# Patient Record
Sex: Male | Born: 1940 | Race: White | Hispanic: No | Marital: Single | Smoking: Never smoker
Health system: Southern US, Community
[De-identification: ages and names within clinical notes are randomized; demographics above are authoritative.]

## PROBLEM LIST (undated history)

## (undated) DIAGNOSIS — N200 Calculus of kidney: Secondary | ICD-10-CM

## (undated) DIAGNOSIS — E785 Hyperlipidemia, unspecified: Secondary | ICD-10-CM

## (undated) DIAGNOSIS — E1169 Type 2 diabetes mellitus with other specified complication: Secondary | ICD-10-CM

## (undated) DIAGNOSIS — N289 Disorder of kidney and ureter, unspecified: Secondary | ICD-10-CM

## (undated) DIAGNOSIS — I10 Essential (primary) hypertension: Secondary | ICD-10-CM

## (undated) HISTORY — PX: LUMBAR FUSION: SHX111

## (undated) HISTORY — PX: KNEE ARTHROSCOPY: SUR90

## (undated) SURGERY — ERCP, WITH INTERVENTION IF INDICATED
Anesthesia: General

---

## 2011-01-14 ENCOUNTER — Emergency Department (HOSPITAL_COMMUNITY)
Admission: EM | Admit: 2011-01-14 | Discharge: 2011-01-14 | Disposition: A | Discharge status: Home / Self Care | Payer: Medicare Other | Attending: Emergency Medicine | Admitting: Emergency Medicine

## 2011-01-14 DIAGNOSIS — E119 Type 2 diabetes mellitus without complications: Secondary | ICD-10-CM | POA: Insufficient documentation

## 2011-01-14 DIAGNOSIS — Z87442 Personal history of urinary calculi: Secondary | ICD-10-CM | POA: Insufficient documentation

## 2011-01-14 DIAGNOSIS — R339 Retention of urine, unspecified: Secondary | ICD-10-CM | POA: Insufficient documentation

## 2011-01-14 DIAGNOSIS — I10 Essential (primary) hypertension: Secondary | ICD-10-CM | POA: Insufficient documentation

## 2011-01-14 DIAGNOSIS — N289 Disorder of kidney and ureter, unspecified: Secondary | ICD-10-CM | POA: Insufficient documentation

## 2011-01-14 LAB — URINALYSIS, ROUTINE W REFLEX MICROSCOPIC
Bilirubin Urine: NEGATIVE
Glucose, UA: NEGATIVE mg/dL
Specific Gravity, Urine: 1.013 (ref 1.005–1.030)

## 2011-01-14 LAB — URINE MICROSCOPIC-ADD ON

## 2011-02-16 ENCOUNTER — Emergency Department (HOSPITAL_COMMUNITY)
Admission: EM | Admit: 2011-02-16 | Discharge: 2011-02-16 | Disposition: A | Discharge status: Home / Self Care | Payer: Medicare Other | Attending: Emergency Medicine | Admitting: Emergency Medicine

## 2011-02-16 DIAGNOSIS — Z87442 Personal history of urinary calculi: Secondary | ICD-10-CM | POA: Insufficient documentation

## 2011-02-16 DIAGNOSIS — E119 Type 2 diabetes mellitus without complications: Secondary | ICD-10-CM | POA: Insufficient documentation

## 2011-02-16 DIAGNOSIS — E785 Hyperlipidemia, unspecified: Secondary | ICD-10-CM | POA: Insufficient documentation

## 2011-02-16 DIAGNOSIS — N400 Enlarged prostate without lower urinary tract symptoms: Secondary | ICD-10-CM | POA: Insufficient documentation

## 2011-02-16 DIAGNOSIS — R339 Retention of urine, unspecified: Secondary | ICD-10-CM | POA: Insufficient documentation

## 2011-02-16 DIAGNOSIS — R109 Unspecified abdominal pain: Secondary | ICD-10-CM | POA: Insufficient documentation

## 2011-02-16 DIAGNOSIS — I10 Essential (primary) hypertension: Secondary | ICD-10-CM | POA: Insufficient documentation

## 2011-08-15 ENCOUNTER — Emergency Department (HOSPITAL_COMMUNITY)
Admission: EM | Admit: 2011-08-15 | Discharge: 2011-08-15 | Disposition: A | Discharge status: Home / Self Care | Payer: Medicare Other | Attending: Emergency Medicine | Admitting: Emergency Medicine

## 2011-08-15 ENCOUNTER — Encounter: Payer: Self-pay | Admitting: *Deleted

## 2011-08-15 DIAGNOSIS — W540XXA Bitten by dog, initial encounter: Secondary | ICD-10-CM | POA: Insufficient documentation

## 2011-08-15 DIAGNOSIS — S41109A Unspecified open wound of unspecified upper arm, initial encounter: Secondary | ICD-10-CM | POA: Insufficient documentation

## 2011-08-15 DIAGNOSIS — S41159A Open bite of unspecified upper arm, initial encounter: Secondary | ICD-10-CM

## 2011-08-15 HISTORY — DX: Essential (primary) hypertension: I10

## 2011-08-15 MED ORDER — AMOXICILLIN-POT CLAVULANATE 875-125 MG PO TABS
1.0000 | ORAL_TABLET | Freq: Once | ORAL | Status: AC
  Administered 2011-08-15: 1 via ORAL
  Filled 2011-08-15: qty 1

## 2011-08-15 MED ORDER — TETANUS-DIPHTH-ACELL PERTUSSIS 5-2.5-18.5 LF-MCG/0.5 IM SUSP
0.5000 mL | Freq: Once | INTRAMUSCULAR | Status: DC
  Filled 2011-08-15: qty 0.5

## 2011-08-15 MED ORDER — AMOXICILLIN-POT CLAVULANATE 875-125 MG PO TABS: 1.0000 | ORAL_TABLET | Freq: Two times a day (BID) | ORAL | Status: AC

## 2011-08-15 MED ORDER — LIDOCAINE-EPINEPHRINE 2 %-1:100000 IJ SOLN
INTRAMUSCULAR | Status: AC
  Administered 2011-08-15: 1 mL
  Filled 2011-08-15: qty 1

## 2011-08-15 NOTE — ED Notes (Signed)
Pt reports his dog bit or clawed him cutting his L forearm. Has used 3 sets of bandaids in last hour to control bleeding. Sts dog is UTD on shots.

## 2011-08-16 NOTE — ED Provider Notes (Signed)
Medical screening examination/treatment/procedure(s) were conducted as a shared visit with non-physician practitioner(s) and myself.  I personally evaluated the patient during the encounter  Hurman Horn, MD 08/16/11 1311

## 2011-08-16 NOTE — ED Provider Notes (Signed)
History     CSN: [id number]  Arrival date & time 08/15/11  1831   First MD Initiated Contact with Patient 08/15/11 1907      Chief Complaint  Patient presents with  . Laceration    (Consider location/radiation/quality/duration/timing/severity/associated sxs/prior treatment) Patient is a 70 y.o. male presenting with animal bite. The history is provided by the patient.  Animal Bite  The incident occurred today. The incident occurred at home. There is an injury to the left forearm. The pain is mild. It is unlikely that a foreign body is present. Pertinent negatives include no numbness, no inability to bear weight, no pain when bearing weight, no focal weakness and no tingling. There have been no prior injuries to these areas. He is right-handed. His tetanus status is out of date.  Pt states he was bit by his dog by accident. 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 Rabies and all vaccines on dog up to date. Pt states unable to stop bleeding at home. No weakness or numbness distal tot he injury.    Past Medical History  Diagnosis Date  . Hypertension   . Diabetes mellitus     History reviewed. No pertinent past surgical history.  No family history on file.  History  Substance Use Topics  . Smoking status: Never Smoker   . Smokeless tobacco: Not on file  . Alcohol Use: Yes     occasionally      Review of Systems  Skin: Positive for wound.  Neurological: Negative for tingling, focal weakness and numbness.  All other systems reviewed and are negative.    Allergies  Review of patient's allergies indicates no known allergies.  Home Medications   Current Outpatient Rx  Name Route Sig Dispense Refill  . AMOXICILLIN-POT CLAVULANATE 875-125 MG PO TABS Oral Take 1 tablet by mouth 2 (two) times daily. 14 tablet 0    BP 115/70  Pulse 60  Temp(Src) 97.8 F (36.6 C) (Oral)  Resp 18  SpO2 98%  Physical Exam  Nursing note and vitals reviewed. Constitutional: He is oriented to  person, place, and time. He appears well-developed and well-nourished. No distress.  HENT:  Head: Normocephalic and atraumatic.  Neck: Neck supple.  Cardiovascular: Normal rate, regular rhythm and normal heart sounds.   Pulmonary/Chest: Effort normal and breath sounds normal. No respiratory distress.  Musculoskeletal: Normal range of motion.       Full rom of wrist and elbow. No bony tenderness  Neurological: He is alert and oriented to person, place, and time.  Skin: Skin is warm and dry.       Two skin tears to the left forearm. No puncture marks or lacerations other then skin tears noted. Both skin tears about 2 cm in diamter, oozing blood  Psychiatric: He has a normal mood and affect.    ED Course  Procedures (including critical care time)  Wounds irrigated thoroughly. NO foreign body palpated. Wounds numbed for full examination with lidocaine 2% w/epi. 2mL used in each skin tear.  Thrombi pad applied and sterile dressing. Will d/chome. No puncture marks. No repair indicated. Tetanus updated.  1. Dog bite of arm       MDM          Lottie Mussel, PA 08/16/11 [phone]

## 2011-08-25 DIAGNOSIS — L820 Inflamed seborrheic keratosis: Secondary | ICD-10-CM | POA: Diagnosis not present

## 2011-08-31 DIAGNOSIS — H25019 Cortical age-related cataract, unspecified eye: Secondary | ICD-10-CM | POA: Diagnosis not present

## 2011-08-31 DIAGNOSIS — D3130 Benign neoplasm of unspecified choroid: Secondary | ICD-10-CM | POA: Diagnosis not present

## 2011-08-31 DIAGNOSIS — H35369 Drusen (degenerative) of macula, unspecified eye: Secondary | ICD-10-CM | POA: Diagnosis not present

## 2011-08-31 DIAGNOSIS — H2510 Age-related nuclear cataract, unspecified eye: Secondary | ICD-10-CM | POA: Diagnosis not present

## 2011-10-23 DIAGNOSIS — D239 Other benign neoplasm of skin, unspecified: Secondary | ICD-10-CM | POA: Diagnosis not present

## 2011-12-01 DIAGNOSIS — E119 Type 2 diabetes mellitus without complications: Secondary | ICD-10-CM | POA: Diagnosis not present

## 2011-12-01 DIAGNOSIS — N200 Calculus of kidney: Secondary | ICD-10-CM | POA: Diagnosis not present

## 2011-12-01 DIAGNOSIS — M1710 Unilateral primary osteoarthritis, unspecified knee: Secondary | ICD-10-CM | POA: Diagnosis not present

## 2011-12-01 DIAGNOSIS — E785 Hyperlipidemia, unspecified: Secondary | ICD-10-CM | POA: Diagnosis not present

## 2012-02-07 DIAGNOSIS — H1130 Conjunctival hemorrhage, unspecified eye: Secondary | ICD-10-CM | POA: Diagnosis not present

## 2012-02-07 DIAGNOSIS — H2510 Age-related nuclear cataract, unspecified eye: Secondary | ICD-10-CM | POA: Diagnosis not present

## 2012-04-26 DIAGNOSIS — R972 Elevated prostate specific antigen [PSA]: Secondary | ICD-10-CM | POA: Diagnosis not present

## 2012-04-26 DIAGNOSIS — I10 Essential (primary) hypertension: Secondary | ICD-10-CM | POA: Diagnosis not present

## 2012-04-26 DIAGNOSIS — E119 Type 2 diabetes mellitus without complications: Secondary | ICD-10-CM | POA: Diagnosis not present

## 2012-04-26 DIAGNOSIS — Z23 Encounter for immunization: Secondary | ICD-10-CM | POA: Diagnosis not present

## 2012-04-26 DIAGNOSIS — E785 Hyperlipidemia, unspecified: Secondary | ICD-10-CM | POA: Diagnosis not present

## 2012-05-07 DIAGNOSIS — R972 Elevated prostate specific antigen [PSA]: Secondary | ICD-10-CM | POA: Diagnosis not present

## 2012-05-07 DIAGNOSIS — N401 Enlarged prostate with lower urinary tract symptoms: Secondary | ICD-10-CM | POA: Diagnosis not present

## 2012-09-06 DIAGNOSIS — M1710 Unilateral primary osteoarthritis, unspecified knee: Secondary | ICD-10-CM | POA: Diagnosis not present

## 2012-09-06 DIAGNOSIS — R972 Elevated prostate specific antigen [PSA]: Secondary | ICD-10-CM | POA: Diagnosis not present

## 2012-09-06 DIAGNOSIS — E119 Type 2 diabetes mellitus without complications: Secondary | ICD-10-CM | POA: Diagnosis not present

## 2012-09-06 DIAGNOSIS — N200 Calculus of kidney: Secondary | ICD-10-CM | POA: Diagnosis not present

## 2013-01-02 DIAGNOSIS — H524 Presbyopia: Secondary | ICD-10-CM | POA: Diagnosis not present

## 2013-01-02 DIAGNOSIS — H2510 Age-related nuclear cataract, unspecified eye: Secondary | ICD-10-CM | POA: Diagnosis not present

## 2013-01-02 DIAGNOSIS — H35369 Drusen (degenerative) of macula, unspecified eye: Secondary | ICD-10-CM | POA: Diagnosis not present

## 2013-01-10 DIAGNOSIS — Z6835 Body mass index (BMI) 35.0-35.9, adult: Secondary | ICD-10-CM | POA: Diagnosis not present

## 2013-01-10 DIAGNOSIS — N200 Calculus of kidney: Secondary | ICD-10-CM | POA: Diagnosis not present

## 2013-01-10 DIAGNOSIS — Z1331 Encounter for screening for depression: Secondary | ICD-10-CM | POA: Diagnosis not present

## 2013-01-10 DIAGNOSIS — R972 Elevated prostate specific antigen [PSA]: Secondary | ICD-10-CM | POA: Diagnosis not present

## 2013-01-10 DIAGNOSIS — I10 Essential (primary) hypertension: Secondary | ICD-10-CM | POA: Diagnosis not present

## 2013-01-10 DIAGNOSIS — E1129 Type 2 diabetes mellitus with other diabetic kidney complication: Secondary | ICD-10-CM | POA: Diagnosis not present

## 2013-01-10 DIAGNOSIS — E785 Hyperlipidemia, unspecified: Secondary | ICD-10-CM | POA: Diagnosis not present

## 2013-02-03 DIAGNOSIS — D485 Neoplasm of uncertain behavior of skin: Secondary | ICD-10-CM | POA: Diagnosis not present

## 2013-02-03 DIAGNOSIS — C44319 Basal cell carcinoma of skin of other parts of face: Secondary | ICD-10-CM | POA: Diagnosis not present

## 2013-02-03 DIAGNOSIS — D1801 Hemangioma of skin and subcutaneous tissue: Secondary | ICD-10-CM | POA: Diagnosis not present

## 2013-03-03 DIAGNOSIS — Z85828 Personal history of other malignant neoplasm of skin: Secondary | ICD-10-CM | POA: Diagnosis not present

## 2013-03-03 DIAGNOSIS — C44319 Basal cell carcinoma of skin of other parts of face: Secondary | ICD-10-CM | POA: Diagnosis not present

## 2013-04-17 DIAGNOSIS — R972 Elevated prostate specific antigen [PSA]: Secondary | ICD-10-CM | POA: Diagnosis not present

## 2013-04-24 DIAGNOSIS — R972 Elevated prostate specific antigen [PSA]: Secondary | ICD-10-CM | POA: Diagnosis not present

## 2013-04-24 DIAGNOSIS — N401 Enlarged prostate with lower urinary tract symptoms: Secondary | ICD-10-CM | POA: Diagnosis not present

## 2013-06-06 DIAGNOSIS — N200 Calculus of kidney: Secondary | ICD-10-CM | POA: Diagnosis not present

## 2013-06-06 DIAGNOSIS — Z23 Encounter for immunization: Secondary | ICD-10-CM | POA: Diagnosis not present

## 2013-06-06 DIAGNOSIS — R972 Elevated prostate specific antigen [PSA]: Secondary | ICD-10-CM | POA: Diagnosis not present

## 2013-06-06 DIAGNOSIS — I10 Essential (primary) hypertension: Secondary | ICD-10-CM | POA: Diagnosis not present

## 2013-06-06 DIAGNOSIS — M1710 Unilateral primary osteoarthritis, unspecified knee: Secondary | ICD-10-CM | POA: Diagnosis not present

## 2013-06-06 DIAGNOSIS — Z6835 Body mass index (BMI) 35.0-35.9, adult: Secondary | ICD-10-CM | POA: Diagnosis not present

## 2013-06-06 DIAGNOSIS — E785 Hyperlipidemia, unspecified: Secondary | ICD-10-CM | POA: Diagnosis not present

## 2013-06-06 DIAGNOSIS — E1169 Type 2 diabetes mellitus with other specified complication: Secondary | ICD-10-CM | POA: Diagnosis not present

## 2013-09-11 DIAGNOSIS — I789 Disease of capillaries, unspecified: Secondary | ICD-10-CM | POA: Diagnosis not present

## 2013-09-11 DIAGNOSIS — Z85828 Personal history of other malignant neoplasm of skin: Secondary | ICD-10-CM | POA: Diagnosis not present

## 2013-09-11 DIAGNOSIS — D1801 Hemangioma of skin and subcutaneous tissue: Secondary | ICD-10-CM | POA: Diagnosis not present

## 2013-09-11 DIAGNOSIS — L821 Other seborrheic keratosis: Secondary | ICD-10-CM | POA: Diagnosis not present

## 2013-10-09 DIAGNOSIS — E785 Hyperlipidemia, unspecified: Secondary | ICD-10-CM | POA: Diagnosis not present

## 2013-10-09 DIAGNOSIS — R972 Elevated prostate specific antigen [PSA]: Secondary | ICD-10-CM | POA: Diagnosis not present

## 2013-10-09 DIAGNOSIS — N200 Calculus of kidney: Secondary | ICD-10-CM | POA: Diagnosis not present

## 2013-10-09 DIAGNOSIS — I10 Essential (primary) hypertension: Secondary | ICD-10-CM | POA: Diagnosis not present

## 2013-10-09 DIAGNOSIS — Z6836 Body mass index (BMI) 36.0-36.9, adult: Secondary | ICD-10-CM | POA: Diagnosis not present

## 2013-10-09 DIAGNOSIS — E1169 Type 2 diabetes mellitus with other specified complication: Secondary | ICD-10-CM | POA: Diagnosis not present

## 2014-02-05 DIAGNOSIS — IMO0002 Reserved for concepts with insufficient information to code with codable children: Secondary | ICD-10-CM | POA: Diagnosis not present

## 2014-02-05 DIAGNOSIS — R972 Elevated prostate specific antigen [PSA]: Secondary | ICD-10-CM | POA: Diagnosis not present

## 2014-02-05 DIAGNOSIS — M1710 Unilateral primary osteoarthritis, unspecified knee: Secondary | ICD-10-CM | POA: Diagnosis not present

## 2014-02-05 DIAGNOSIS — Z1331 Encounter for screening for depression: Secondary | ICD-10-CM | POA: Diagnosis not present

## 2014-02-05 DIAGNOSIS — E785 Hyperlipidemia, unspecified: Secondary | ICD-10-CM | POA: Diagnosis not present

## 2014-02-05 DIAGNOSIS — N200 Calculus of kidney: Secondary | ICD-10-CM | POA: Diagnosis not present

## 2014-02-05 DIAGNOSIS — I10 Essential (primary) hypertension: Secondary | ICD-10-CM | POA: Diagnosis not present

## 2014-02-05 DIAGNOSIS — Z6835 Body mass index (BMI) 35.0-35.9, adult: Secondary | ICD-10-CM | POA: Diagnosis not present

## 2014-02-05 DIAGNOSIS — E119 Type 2 diabetes mellitus without complications: Secondary | ICD-10-CM | POA: Diagnosis not present

## 2014-02-25 DIAGNOSIS — H35369 Drusen (degenerative) of macula, unspecified eye: Secondary | ICD-10-CM | POA: Diagnosis not present

## 2014-02-25 DIAGNOSIS — D3130 Benign neoplasm of unspecified choroid: Secondary | ICD-10-CM | POA: Diagnosis not present

## 2014-02-25 DIAGNOSIS — H2510 Age-related nuclear cataract, unspecified eye: Secondary | ICD-10-CM | POA: Diagnosis not present

## 2014-02-25 DIAGNOSIS — H524 Presbyopia: Secondary | ICD-10-CM | POA: Diagnosis not present

## 2014-04-24 DIAGNOSIS — N139 Obstructive and reflux uropathy, unspecified: Secondary | ICD-10-CM | POA: Diagnosis not present

## 2014-04-24 DIAGNOSIS — R972 Elevated prostate specific antigen [PSA]: Secondary | ICD-10-CM | POA: Diagnosis not present

## 2014-04-24 DIAGNOSIS — N401 Enlarged prostate with lower urinary tract symptoms: Secondary | ICD-10-CM | POA: Diagnosis not present

## 2014-04-29 DIAGNOSIS — R972 Elevated prostate specific antigen [PSA]: Secondary | ICD-10-CM | POA: Diagnosis not present

## 2014-04-29 DIAGNOSIS — N138 Other obstructive and reflux uropathy: Secondary | ICD-10-CM | POA: Diagnosis not present

## 2014-04-29 DIAGNOSIS — N401 Enlarged prostate with lower urinary tract symptoms: Secondary | ICD-10-CM | POA: Diagnosis not present

## 2014-04-29 DIAGNOSIS — N139 Obstructive and reflux uropathy, unspecified: Secondary | ICD-10-CM | POA: Diagnosis not present

## 2014-06-06 DIAGNOSIS — N200 Calculus of kidney: Secondary | ICD-10-CM | POA: Diagnosis not present

## 2014-06-06 DIAGNOSIS — E780 Pure hypercholesterolemia: Secondary | ICD-10-CM | POA: Diagnosis not present

## 2014-06-06 DIAGNOSIS — R319 Hematuria, unspecified: Secondary | ICD-10-CM | POA: Diagnosis not present

## 2014-06-06 DIAGNOSIS — R3911 Hesitancy of micturition: Secondary | ICD-10-CM | POA: Diagnosis not present

## 2014-06-06 DIAGNOSIS — I10 Essential (primary) hypertension: Secondary | ICD-10-CM | POA: Diagnosis not present

## 2014-06-06 DIAGNOSIS — Z7982 Long term (current) use of aspirin: Secondary | ICD-10-CM | POA: Diagnosis not present

## 2014-06-11 DIAGNOSIS — D2261 Melanocytic nevi of right upper limb, including shoulder: Secondary | ICD-10-CM | POA: Diagnosis not present

## 2014-06-11 DIAGNOSIS — D225 Melanocytic nevi of trunk: Secondary | ICD-10-CM | POA: Diagnosis not present

## 2014-06-11 DIAGNOSIS — D2262 Melanocytic nevi of left upper limb, including shoulder: Secondary | ICD-10-CM | POA: Diagnosis not present

## 2014-06-11 DIAGNOSIS — Z85828 Personal history of other malignant neoplasm of skin: Secondary | ICD-10-CM | POA: Diagnosis not present

## 2014-06-11 DIAGNOSIS — L821 Other seborrheic keratosis: Secondary | ICD-10-CM | POA: Diagnosis not present

## 2014-06-11 DIAGNOSIS — D1801 Hemangioma of skin and subcutaneous tissue: Secondary | ICD-10-CM | POA: Diagnosis not present

## 2014-06-30 DIAGNOSIS — E785 Hyperlipidemia, unspecified: Secondary | ICD-10-CM | POA: Diagnosis not present

## 2014-06-30 DIAGNOSIS — Z23 Encounter for immunization: Secondary | ICD-10-CM | POA: Diagnosis not present

## 2014-06-30 DIAGNOSIS — R972 Elevated prostate specific antigen [PSA]: Secondary | ICD-10-CM | POA: Diagnosis not present

## 2014-06-30 DIAGNOSIS — M170 Bilateral primary osteoarthritis of knee: Secondary | ICD-10-CM | POA: Diagnosis not present

## 2014-06-30 DIAGNOSIS — E119 Type 2 diabetes mellitus without complications: Secondary | ICD-10-CM | POA: Diagnosis not present

## 2014-06-30 DIAGNOSIS — E559 Vitamin D deficiency, unspecified: Secondary | ICD-10-CM | POA: Diagnosis not present

## 2014-06-30 DIAGNOSIS — Z008 Encounter for other general examination: Secondary | ICD-10-CM | POA: Diagnosis not present

## 2014-06-30 DIAGNOSIS — I10 Essential (primary) hypertension: Secondary | ICD-10-CM | POA: Diagnosis not present

## 2014-06-30 DIAGNOSIS — N200 Calculus of kidney: Secondary | ICD-10-CM | POA: Diagnosis not present

## 2014-10-30 DIAGNOSIS — R972 Elevated prostate specific antigen [PSA]: Secondary | ICD-10-CM | POA: Diagnosis not present

## 2014-11-05 DIAGNOSIS — R972 Elevated prostate specific antigen [PSA]: Secondary | ICD-10-CM | POA: Diagnosis not present

## 2014-11-05 DIAGNOSIS — E119 Type 2 diabetes mellitus without complications: Secondary | ICD-10-CM | POA: Diagnosis not present

## 2014-11-05 DIAGNOSIS — E785 Hyperlipidemia, unspecified: Secondary | ICD-10-CM | POA: Diagnosis not present

## 2014-11-05 DIAGNOSIS — N200 Calculus of kidney: Secondary | ICD-10-CM | POA: Diagnosis not present

## 2014-11-05 DIAGNOSIS — Z6836 Body mass index (BMI) 36.0-36.9, adult: Secondary | ICD-10-CM | POA: Diagnosis not present

## 2014-11-05 DIAGNOSIS — M170 Bilateral primary osteoarthritis of knee: Secondary | ICD-10-CM | POA: Diagnosis not present

## 2015-02-16 DIAGNOSIS — Z85828 Personal history of other malignant neoplasm of skin: Secondary | ICD-10-CM | POA: Diagnosis not present

## 2015-02-16 DIAGNOSIS — L723 Sebaceous cyst: Secondary | ICD-10-CM | POA: Diagnosis not present

## 2015-02-16 DIAGNOSIS — I788 Other diseases of capillaries: Secondary | ICD-10-CM | POA: Diagnosis not present

## 2015-03-17 DIAGNOSIS — R972 Elevated prostate specific antigen [PSA]: Secondary | ICD-10-CM | POA: Diagnosis not present

## 2015-03-17 DIAGNOSIS — E119 Type 2 diabetes mellitus without complications: Secondary | ICD-10-CM | POA: Diagnosis not present

## 2015-03-17 DIAGNOSIS — E785 Hyperlipidemia, unspecified: Secondary | ICD-10-CM | POA: Diagnosis not present

## 2015-03-17 DIAGNOSIS — N200 Calculus of kidney: Secondary | ICD-10-CM | POA: Diagnosis not present

## 2015-03-17 DIAGNOSIS — I10 Essential (primary) hypertension: Secondary | ICD-10-CM | POA: Diagnosis not present

## 2015-03-17 DIAGNOSIS — E559 Vitamin D deficiency, unspecified: Secondary | ICD-10-CM | POA: Diagnosis not present

## 2015-03-17 DIAGNOSIS — Z6836 Body mass index (BMI) 36.0-36.9, adult: Secondary | ICD-10-CM | POA: Diagnosis not present

## 2015-03-17 DIAGNOSIS — Z1389 Encounter for screening for other disorder: Secondary | ICD-10-CM | POA: Diagnosis not present

## 2015-04-30 DIAGNOSIS — N401 Enlarged prostate with lower urinary tract symptoms: Secondary | ICD-10-CM | POA: Diagnosis not present

## 2015-05-05 DIAGNOSIS — N401 Enlarged prostate with lower urinary tract symptoms: Secondary | ICD-10-CM | POA: Diagnosis not present

## 2015-05-05 DIAGNOSIS — N138 Other obstructive and reflux uropathy: Secondary | ICD-10-CM | POA: Diagnosis not present

## 2015-05-05 DIAGNOSIS — R972 Elevated prostate specific antigen [PSA]: Secondary | ICD-10-CM | POA: Diagnosis not present

## 2015-05-09 DIAGNOSIS — R1030 Lower abdominal pain, unspecified: Secondary | ICD-10-CM | POA: Diagnosis not present

## 2015-05-09 DIAGNOSIS — K8020 Calculus of gallbladder without cholecystitis without obstruction: Secondary | ICD-10-CM | POA: Diagnosis not present

## 2015-05-09 DIAGNOSIS — K5730 Diverticulosis of large intestine without perforation or abscess without bleeding: Secondary | ICD-10-CM | POA: Diagnosis not present

## 2015-05-09 DIAGNOSIS — N23 Unspecified renal colic: Secondary | ICD-10-CM | POA: Diagnosis not present

## 2015-05-09 DIAGNOSIS — E780 Pure hypercholesterolemia: Secondary | ICD-10-CM | POA: Diagnosis not present

## 2015-05-09 DIAGNOSIS — I10 Essential (primary) hypertension: Secondary | ICD-10-CM | POA: Diagnosis not present

## 2015-05-09 DIAGNOSIS — Z7982 Long term (current) use of aspirin: Secondary | ICD-10-CM | POA: Diagnosis not present

## 2015-06-17 DIAGNOSIS — D1801 Hemangioma of skin and subcutaneous tissue: Secondary | ICD-10-CM | POA: Diagnosis not present

## 2015-06-17 DIAGNOSIS — L918 Other hypertrophic disorders of the skin: Secondary | ICD-10-CM | POA: Diagnosis not present

## 2015-06-17 DIAGNOSIS — Z85828 Personal history of other malignant neoplasm of skin: Secondary | ICD-10-CM | POA: Diagnosis not present

## 2015-06-17 DIAGNOSIS — L821 Other seborrheic keratosis: Secondary | ICD-10-CM | POA: Diagnosis not present

## 2015-06-17 DIAGNOSIS — D225 Melanocytic nevi of trunk: Secondary | ICD-10-CM | POA: Diagnosis not present

## 2015-06-17 DIAGNOSIS — L570 Actinic keratosis: Secondary | ICD-10-CM | POA: Diagnosis not present

## 2015-06-17 DIAGNOSIS — L812 Freckles: Secondary | ICD-10-CM | POA: Diagnosis not present

## 2015-06-17 DIAGNOSIS — L853 Xerosis cutis: Secondary | ICD-10-CM | POA: Diagnosis not present

## 2015-06-30 DIAGNOSIS — Z23 Encounter for immunization: Secondary | ICD-10-CM | POA: Diagnosis not present

## 2015-08-18 DIAGNOSIS — Z6834 Body mass index (BMI) 34.0-34.9, adult: Secondary | ICD-10-CM | POA: Diagnosis not present

## 2015-08-18 DIAGNOSIS — E559 Vitamin D deficiency, unspecified: Secondary | ICD-10-CM | POA: Diagnosis not present

## 2015-08-18 DIAGNOSIS — M170 Bilateral primary osteoarthritis of knee: Secondary | ICD-10-CM | POA: Diagnosis not present

## 2015-08-18 DIAGNOSIS — E119 Type 2 diabetes mellitus without complications: Secondary | ICD-10-CM | POA: Diagnosis not present

## 2015-08-18 DIAGNOSIS — E784 Other hyperlipidemia: Secondary | ICD-10-CM | POA: Diagnosis not present

## 2015-08-18 DIAGNOSIS — I10 Essential (primary) hypertension: Secondary | ICD-10-CM | POA: Diagnosis not present

## 2015-08-18 DIAGNOSIS — N200 Calculus of kidney: Secondary | ICD-10-CM | POA: Diagnosis not present

## 2015-08-18 DIAGNOSIS — R972 Elevated prostate specific antigen [PSA]: Secondary | ICD-10-CM | POA: Diagnosis not present

## 2015-08-19 DIAGNOSIS — N400 Enlarged prostate without lower urinary tract symptoms: Secondary | ICD-10-CM | POA: Diagnosis not present

## 2015-08-19 DIAGNOSIS — R7989 Other specified abnormal findings of blood chemistry: Secondary | ICD-10-CM | POA: Diagnosis not present

## 2015-08-19 DIAGNOSIS — K828 Other specified diseases of gallbladder: Secondary | ICD-10-CM | POA: Diagnosis not present

## 2015-08-19 DIAGNOSIS — K8020 Calculus of gallbladder without cholecystitis without obstruction: Secondary | ICD-10-CM | POA: Diagnosis not present

## 2015-08-19 DIAGNOSIS — K4020 Bilateral inguinal hernia, without obstruction or gangrene, not specified as recurrent: Secondary | ICD-10-CM | POA: Diagnosis not present

## 2015-08-19 DIAGNOSIS — N200 Calculus of kidney: Secondary | ICD-10-CM | POA: Diagnosis not present

## 2015-09-03 DIAGNOSIS — R945 Abnormal results of liver function studies: Secondary | ICD-10-CM | POA: Diagnosis not present

## 2015-09-03 DIAGNOSIS — R109 Unspecified abdominal pain: Secondary | ICD-10-CM | POA: Diagnosis not present

## 2015-09-03 DIAGNOSIS — Z6834 Body mass index (BMI) 34.0-34.9, adult: Secondary | ICD-10-CM | POA: Diagnosis not present

## 2015-09-14 DIAGNOSIS — Z85828 Personal history of other malignant neoplasm of skin: Secondary | ICD-10-CM | POA: Diagnosis not present

## 2015-09-14 DIAGNOSIS — L309 Dermatitis, unspecified: Secondary | ICD-10-CM | POA: Diagnosis not present

## 2015-09-18 ENCOUNTER — Emergency Department (HOSPITAL_COMMUNITY): Payer: Medicare Other

## 2015-09-18 ENCOUNTER — Encounter (HOSPITAL_COMMUNITY): Payer: Self-pay | Admitting: Emergency Medicine

## 2015-09-18 ENCOUNTER — Inpatient Hospital Stay (HOSPITAL_COMMUNITY)
Admission: EM | Admit: 2015-09-18 | Discharge: 2015-09-24 | DRG: 415 | Disposition: A | Discharge status: Home / Self Care | Payer: Medicare Other | Attending: Internal Medicine | Admitting: Internal Medicine

## 2015-09-18 DIAGNOSIS — K8020 Calculus of gallbladder without cholecystitis without obstruction: Secondary | ICD-10-CM | POA: Diagnosis not present

## 2015-09-18 DIAGNOSIS — E785 Hyperlipidemia, unspecified: Secondary | ICD-10-CM | POA: Diagnosis present

## 2015-09-18 DIAGNOSIS — K838 Other specified diseases of biliary tract: Secondary | ICD-10-CM | POA: Diagnosis not present

## 2015-09-18 DIAGNOSIS — K8012 Calculus of gallbladder with acute and chronic cholecystitis without obstruction: Secondary | ICD-10-CM | POA: Diagnosis not present

## 2015-09-18 DIAGNOSIS — I959 Hypotension, unspecified: Secondary | ICD-10-CM | POA: Diagnosis not present

## 2015-09-18 DIAGNOSIS — R7989 Other specified abnormal findings of blood chemistry: Secondary | ICD-10-CM | POA: Diagnosis not present

## 2015-09-18 DIAGNOSIS — K8050 Calculus of bile duct without cholangitis or cholecystitis without obstruction: Secondary | ICD-10-CM | POA: Diagnosis not present

## 2015-09-18 DIAGNOSIS — Z419 Encounter for procedure for purposes other than remedying health state, unspecified: Secondary | ICD-10-CM

## 2015-09-18 DIAGNOSIS — R338 Other retention of urine: Secondary | ICD-10-CM | POA: Diagnosis not present

## 2015-09-18 DIAGNOSIS — I10 Essential (primary) hypertension: Secondary | ICD-10-CM | POA: Diagnosis present

## 2015-09-18 DIAGNOSIS — Z7984 Long term (current) use of oral hypoglycemic drugs: Secondary | ICD-10-CM

## 2015-09-18 DIAGNOSIS — K8010 Calculus of gallbladder with chronic cholecystitis without obstruction: Secondary | ICD-10-CM | POA: Diagnosis not present

## 2015-09-18 DIAGNOSIS — K660 Peritoneal adhesions (postprocedural) (postinfection): Secondary | ICD-10-CM | POA: Diagnosis present

## 2015-09-18 DIAGNOSIS — K2970 Gastritis, unspecified, without bleeding: Secondary | ICD-10-CM | POA: Diagnosis not present

## 2015-09-18 DIAGNOSIS — K8051 Calculus of bile duct without cholangitis or cholecystitis with obstruction: Secondary | ICD-10-CM | POA: Diagnosis present

## 2015-09-18 DIAGNOSIS — Z5331 Laparoscopic surgical procedure converted to open procedure: Secondary | ICD-10-CM | POA: Diagnosis not present

## 2015-09-18 DIAGNOSIS — R945 Abnormal results of liver function studies: Secondary | ICD-10-CM

## 2015-09-18 DIAGNOSIS — R339 Retention of urine, unspecified: Secondary | ICD-10-CM | POA: Diagnosis present

## 2015-09-18 DIAGNOSIS — Z7982 Long term (current) use of aspirin: Secondary | ICD-10-CM | POA: Diagnosis not present

## 2015-09-18 DIAGNOSIS — E119 Type 2 diabetes mellitus without complications: Secondary | ICD-10-CM

## 2015-09-18 DIAGNOSIS — K8063 Calculus of gallbladder and bile duct with acute cholecystitis with obstruction: Secondary | ICD-10-CM | POA: Diagnosis not present

## 2015-09-18 DIAGNOSIS — K2980 Duodenitis without bleeding: Secondary | ICD-10-CM | POA: Diagnosis present

## 2015-09-18 DIAGNOSIS — K8309 Other cholangitis: Secondary | ICD-10-CM | POA: Diagnosis present

## 2015-09-18 DIAGNOSIS — N179 Acute kidney failure, unspecified: Secondary | ICD-10-CM | POA: Diagnosis present

## 2015-09-18 DIAGNOSIS — N200 Calculus of kidney: Secondary | ICD-10-CM

## 2015-09-18 DIAGNOSIS — R109 Unspecified abdominal pain: Secondary | ICD-10-CM | POA: Diagnosis present

## 2015-09-18 DIAGNOSIS — K828 Other specified diseases of gallbladder: Secondary | ICD-10-CM | POA: Diagnosis present

## 2015-09-18 DIAGNOSIS — K8043 Calculus of bile duct with acute cholecystitis with obstruction: Secondary | ICD-10-CM | POA: Diagnosis not present

## 2015-09-18 DIAGNOSIS — K8021 Calculus of gallbladder without cholecystitis with obstruction: Secondary | ICD-10-CM | POA: Diagnosis not present

## 2015-09-18 DIAGNOSIS — E1169 Type 2 diabetes mellitus with other specified complication: Secondary | ICD-10-CM | POA: Diagnosis present

## 2015-09-18 DIAGNOSIS — K830 Cholangitis: Secondary | ICD-10-CM | POA: Diagnosis not present

## 2015-09-18 DIAGNOSIS — K8033 Calculus of bile duct with acute cholangitis with obstruction: Secondary | ICD-10-CM | POA: Diagnosis not present

## 2015-09-18 DIAGNOSIS — R1111 Vomiting without nausea: Secondary | ICD-10-CM | POA: Diagnosis not present

## 2015-09-18 DIAGNOSIS — R10A1 Flank pain, right side: Secondary | ICD-10-CM | POA: Diagnosis present

## 2015-09-18 DIAGNOSIS — N183 Chronic kidney disease, stage 3 (moderate): Secondary | ICD-10-CM

## 2015-09-18 DIAGNOSIS — R112 Nausea with vomiting, unspecified: Secondary | ICD-10-CM | POA: Diagnosis not present

## 2015-09-18 DIAGNOSIS — E1122 Type 2 diabetes mellitus with diabetic chronic kidney disease: Secondary | ICD-10-CM

## 2015-09-18 HISTORY — DX: Hyperlipidemia, unspecified: E78.5

## 2015-09-18 HISTORY — DX: Type 2 diabetes mellitus with other specified complication: E11.69

## 2015-09-18 HISTORY — DX: Calculus of kidney: N20.0

## 2015-09-18 HISTORY — DX: Disorder of kidney and ureter, unspecified: N28.9

## 2015-09-18 LAB — CBC WITH DIFFERENTIAL/PLATELET
Basophils Absolute: 0 10*3/uL (ref 0.0–0.1)
Basophils Relative: 0 %
EOS ABS: 0 10*3/uL (ref 0.0–0.7)
EOS PCT: 0 %
HCT: 40.4 % (ref 39.0–52.0)
HEMOGLOBIN: 13.3 g/dL (ref 13.0–17.0)
LYMPHS ABS: 1.2 10*3/uL (ref 0.7–4.0)
Lymphocytes Relative: 7 %
MCH: 30.9 pg (ref 26.0–34.0)
MCHC: 32.9 g/dL (ref 30.0–36.0)
MCV: 94 fL (ref 78.0–100.0)
MONOS PCT: 5 %
Monocytes Absolute: 0.9 10*3/uL (ref 0.1–1.0)
Neutro Abs: 15.3 10*3/uL — ABNORMAL HIGH (ref 1.7–7.7)
Neutrophils Relative %: 88 %
PLATELETS: 272 10*3/uL (ref 150–400)
RBC: 4.3 MIL/uL (ref 4.22–5.81)
RDW: 15.2 % (ref 11.5–15.5)
WBC: 17.4 10*3/uL — AB (ref 4.0–10.5)

## 2015-09-18 LAB — URINE MICROSCOPIC-ADD ON

## 2015-09-18 LAB — COMPREHENSIVE METABOLIC PANEL
ALK PHOS: 617 U/L — AB (ref 38–126)
ALT: 186 U/L — ABNORMAL HIGH (ref 17–63)
ANION GAP: 12 (ref 5–15)
AST: 116 U/L — ABNORMAL HIGH (ref 15–41)
Albumin: 3 g/dL — ABNORMAL LOW (ref 3.5–5.0)
BUN: 33 mg/dL — ABNORMAL HIGH (ref 6–20)
CALCIUM: 9.1 mg/dL (ref 8.9–10.3)
CO2: 22 mmol/L (ref 22–32)
Chloride: 105 mmol/L (ref 101–111)
Creatinine, Ser: 1.33 mg/dL — ABNORMAL HIGH (ref 0.61–1.24)
GFR calc non Af Amer: 51 mL/min — ABNORMAL LOW (ref >60)
GFR, EST AFRICAN AMERICAN: 59 mL/min — AB (ref >60)
Glucose, Bld: 142 mg/dL — ABNORMAL HIGH (ref 65–99)
Potassium: 4.4 mmol/L (ref 3.5–5.1)
SODIUM: 139 mmol/L (ref 135–145)
Total Bilirubin: 5.5 mg/dL — ABNORMAL HIGH (ref 0.3–1.2)
Total Protein: 6.2 g/dL — ABNORMAL LOW (ref 6.5–8.1)

## 2015-09-18 LAB — URINALYSIS, ROUTINE W REFLEX MICROSCOPIC
GLUCOSE, UA: NEGATIVE mg/dL
KETONES UR: NEGATIVE mg/dL
LEUKOCYTES UA: NEGATIVE
Nitrite: NEGATIVE
PH: 5 (ref 5.0–8.0)
Protein, ur: NEGATIVE mg/dL
Specific Gravity, Urine: 1.014 (ref 1.005–1.030)

## 2015-09-18 LAB — GLUCOSE, CAPILLARY
GLUCOSE-CAPILLARY: 121 mg/dL — AB (ref 65–99)
GLUCOSE-CAPILLARY: 94 mg/dL (ref 65–99)
GLUCOSE-CAPILLARY: 77 mg/dL (ref 65–99)

## 2015-09-18 MED ORDER — MORPHINE SULFATE (PF) 4 MG/ML IV SOLN: 4.0000 mg | INTRAVENOUS | Status: DC | PRN

## 2015-09-18 MED ORDER — ACETAMINOPHEN 650 MG RE SUPP: 650.0000 mg | Freq: Four times a day (QID) | RECTAL | Status: DC | PRN

## 2015-09-18 MED ORDER — CETYLPYRIDINIUM CHLORIDE 0.05 % MT LIQD
7.0000 mL | Freq: Two times a day (BID) | OROMUCOSAL | Status: DC
  Administered 2015-09-18 – 2015-09-21 (×5): 7 mL via OROMUCOSAL

## 2015-09-18 MED ORDER — ASPIRIN 81 MG PO CHEW
81.0000 mg | CHEWABLE_TABLET | Freq: Every day | ORAL | Status: DC
  Administered 2015-09-18: 81 mg via ORAL
  Filled 2015-09-18: qty 1

## 2015-09-18 MED ORDER — ONDANSETRON HCL 4 MG/2ML IJ SOLN: 4.0000 mg | Freq: Four times a day (QID) | INTRAMUSCULAR | Status: DC | PRN

## 2015-09-18 MED ORDER — ALPRAZOLAM 0.5 MG PO TABS
0.5000 mg | ORAL_TABLET | Freq: Three times a day (TID) | ORAL | Status: DC | PRN
  Administered 2015-09-18 – 2015-09-23 (×8): 0.5 mg via ORAL
  Filled 2015-09-18 (×8): qty 1

## 2015-09-18 MED ORDER — DUTASTERIDE 0.5 MG PO CAPS
0.5000 mg | ORAL_CAPSULE | Freq: Every day | ORAL | Status: DC
  Administered 2015-09-18 – 2015-09-20 (×3): 0.5 mg via ORAL
  Filled 2015-09-18 (×4): qty 1

## 2015-09-18 MED ORDER — SODIUM CHLORIDE 0.9 % IV SOLN: Freq: Once | INTRAVENOUS | Status: DC

## 2015-09-18 MED ORDER — DEXTROSE-NACL 5-0.9 % IV SOLN
INTRAVENOUS | Status: DC
  Administered 2015-09-18 – 2015-09-19 (×2): via INTRAVENOUS

## 2015-09-18 MED ORDER — PIPERACILLIN-TAZOBACTAM 3.375 G IVPB 30 MIN
3.3750 g | Freq: Once | INTRAVENOUS | Status: AC
  Administered 2015-09-18: 3.375 g via INTRAVENOUS
  Filled 2015-09-18: qty 50

## 2015-09-18 MED ORDER — ACETAMINOPHEN 325 MG PO TABS: 650.0000 mg | ORAL_TABLET | Freq: Four times a day (QID) | ORAL | Status: DC | PRN

## 2015-09-18 MED ORDER — SODIUM CHLORIDE 0.9 % IV SOLN
INTRAVENOUS | Status: DC
  Administered 2015-09-18: 11:00:00 via INTRAVENOUS

## 2015-09-18 MED ORDER — ONDANSETRON HCL 4 MG PO TABS: 4.0000 mg | ORAL_TABLET | Freq: Four times a day (QID) | ORAL | Status: DC | PRN

## 2015-09-18 MED ORDER — ROSUVASTATIN CALCIUM 10 MG PO TABS
10.0000 mg | ORAL_TABLET | Freq: Every day | ORAL | Status: DC
  Administered 2015-09-18 – 2015-09-23 (×5): 10 mg via ORAL
  Filled 2015-09-18 (×7): qty 1

## 2015-09-18 MED ORDER — LISINOPRIL 40 MG PO TABS
40.0000 mg | ORAL_TABLET | Freq: Every day | ORAL | Status: DC
  Administered 2015-09-18 – 2015-09-19 (×2): 40 mg via ORAL
  Filled 2015-09-18 (×3): qty 1

## 2015-09-18 MED ORDER — CHLORHEXIDINE GLUCONATE 0.12 % MT SOLN
15.0000 mL | Freq: Two times a day (BID) | OROMUCOSAL | Status: DC
  Administered 2015-09-18 – 2015-09-23 (×11): 15 mL via OROMUCOSAL
  Filled 2015-09-18 (×18): qty 15

## 2015-09-18 MED ORDER — OXYCODONE HCL 5 MG PO TABS
5.0000 mg | ORAL_TABLET | ORAL | Status: DC | PRN
  Administered 2015-09-20 – 2015-09-24 (×4): 5 mg via ORAL
  Filled 2015-09-18 (×5): qty 1

## 2015-09-18 MED ORDER — INSULIN ASPART 100 UNIT/ML ~~LOC~~ SOLN
0.0000 [IU] | SUBCUTANEOUS | Status: DC
  Administered 2015-09-18: 1 [IU] via SUBCUTANEOUS
  Administered 2015-09-19 (×2): 2 [IU] via SUBCUTANEOUS
  Administered 2015-09-19: 1 [IU] via SUBCUTANEOUS
  Administered 2015-09-19: 2 [IU] via SUBCUTANEOUS
  Administered 2015-09-20: 1 [IU] via SUBCUTANEOUS

## 2015-09-18 MED ORDER — SODIUM CHLORIDE 0.9 % IV BOLUS (SEPSIS)
1000.0000 mL | Freq: Once | INTRAVENOUS | Status: AC
  Administered 2015-09-18: 1000 mL via INTRAVENOUS

## 2015-09-18 MED ORDER — PIPERACILLIN-TAZOBACTAM 3.375 G IVPB
3.3750 g | Freq: Three times a day (TID) | INTRAVENOUS | Status: DC
  Administered 2015-09-18 – 2015-09-20 (×6): 3.375 g via INTRAVENOUS
  Filled 2015-09-18 (×7): qty 50

## 2015-09-18 MED ORDER — ENOXAPARIN SODIUM 40 MG/0.4ML ~~LOC~~ SOLN
40.0000 mg | SUBCUTANEOUS | Status: DC
  Administered 2015-09-18: 40 mg via SUBCUTANEOUS
  Filled 2015-09-18: qty 0.4

## 2015-09-18 MED ORDER — ALUM & MAG HYDROXIDE-SIMETH 200-200-20 MG/5ML PO SUSP
30.0000 mL | Freq: Four times a day (QID) | ORAL | Status: DC | PRN
  Administered 2015-09-20 – 2015-09-22 (×3): 30 mL via ORAL
  Filled 2015-09-18 (×3): qty 30

## 2015-09-18 NOTE — ED Provider Notes (Signed)
  Physical Exam  BP 99/57 mmHg  Pulse 72  Temp(Src) 98 F (36.7 C) (Oral)  Resp 20  SpO2 94%  Physical Exam  ED Course  Procedures  MDM Presents with L Flank Pain LFT Increased, Leukocytosis CT showed Stones in Distal common bile duct as well as Non Obstructing Kidney Stone [place] pending for Acute Chole vs Cholangitis  US Showed- IMPRESSION: 1. Dilatation of the common bile duct and intrahepatic biliary ductal dilatation, reflecting the obstructing stone at the distal common bile duct as seen on CT. The known obstructing stone is not characterized on this study due to overlying bowel gas. 2. Underlying cholelithiasis. No evidence for cholecystitis.  Admit w/ Consult to GI     Shary Decamp, PA-C 09/18/15 [place], MD 09/18/15 2300

## 2015-09-18 NOTE — ED Notes (Signed)
Brought in by EMS from home with c/o flank pain.  Pt reports that he started having right flank pain with nausea and vomiting last night, onset very sudden.  Pt has hx of kidney stones.  Was given Fentanyl 150 mcg IV en route to ED.

## 2015-09-18 NOTE — ED Provider Notes (Signed)
Medical screening examination/treatment/procedure(s) were conducted as a shared visit with non-physician practitioner(s) and myself.  I personally evaluated the patient during the encounter.   EKG Interpretation None      This is a 75 year old male who presents with right-sided flank pain. History of kidney stones. Reports last week he had similar symptoms but they came and went. He had worsening abdominal pain postprandially last night. Reports nausea and vomiting. No diarrhea. Currently he is pain-free after receiving 150  grams of fentanyl en route. Reports recent history of LFT elevations but "they were going down." Nontoxic appearing. Vital signs are reassuring. Minimal right upper quadrant and right-sided flank tenderness. Lab work obtained and shows white count to 17.4, bili 5.5, elevated LFTs with an alkaline phosphatase in the 600s. Unknown baseline. CT scan renal study had already been ordered and shows an obstructing common bile duct stone. Subsequent ultrasound was ordered. Patient was given Zosyn. He is afebrile.  Ultrasound is unable to visualize the stone but does show common bile duct dilation. Patient was given fluids and is nothing by mouth. Will consult GI and admit to medicine.  Merryl Hacker, MD 09/18/15 [phone]

## 2015-09-18 NOTE — Anesthesia Preprocedure Evaluation (Addendum)
Anesthesia Evaluation  Patient identified by MRN, date of birth, ID band Patient awake    Reviewed: Allergy & Precautions, H&P , NPO status , Patient's Chart, lab work & pertinent test results, reviewed documented beta blocker date and time   Airway Mallampati: II  TM Distance: >3 FB Neck ROM: full    Dental no notable dental hx. (+) Teeth Intact   Pulmonary neg pulmonary ROS,    Pulmonary exam normal        Cardiovascular hypertension, Pt. on medications Normal cardiovascular exam     Neuro/Psych negative neurological ROS  negative psych ROS   GI/Hepatic negative GI ROS, Neg liver ROS,   Endo/Other  diabetes, Type 2, Oral Hypoglycemic Agents  Renal/GU      Musculoskeletal   Abdominal (+) + obese,   Peds  Hematology negative hematology ROS (+)   Anesthesia Other Findings   Reproductive/Obstetrics negative OB ROS                           Anesthesia Physical Anesthesia Plan  ASA: II  Anesthesia Plan: General   Post-op Pain Management:    Induction: Intravenous  Airway Management Planned: Oral ETT  Additional Equipment:   Intra-op Plan:   Post-operative Plan: Extubation in OR  Informed Consent: I have reviewed the patients History and Physical, chart, labs and discussed the procedure including the risks, benefits and alternatives for the proposed anesthesia with the patient or authorized representative who has indicated his/her understanding and acceptance.   Dental Advisory Given  Plan Discussed with: CRNA and Surgeon  Anesthesia Plan Comments:        Anesthesia Quick Evaluation

## 2015-09-18 NOTE — H&P (Signed)
History and Physical:    Gary Roach   [id number] DOB: 03-20-1941 DOA: 09/18/2015  Referring MD/provider: Dr. Dina Rich PCP: Sheela Stack, MD   Chief Complaint: Right flank pain  History of Present Illness:   Gary Roach is an 75 y.o. male with a PMH of hypertension and diabetes, prior nephrolithiasis who presented to the ER this morning with a chief complaint of the sudden onset of right flank pain accompanied by nausea and vomiting. Patient thought his symptoms were from a recurrent kidney stone. He had some transient colicky type pains 1 week prior to this, but they were intermittent and self-limiting. He described the pain as acute in onset, sharp/aching and rated 10/10 at its worst. No specific aggravating or alleviating factors noted. He reported some abdominal pain after eating last evening but did not have any nausea or vomiting at that time. Upon initial evaluation in the ED, the patient was noted to have right upper quadrant and right-sided flank tenderness. Lab work revealed elevated WBC, bilirubin, and LFTs. CT scan obtained and showed an obstructing common bile duct stone.  ROS:   Review of Systems  Constitutional: Positive for chills. Negative for fever and malaise/fatigue.  HENT: Negative.   Eyes: Negative.   Respiratory: Negative for cough and shortness of breath.   Cardiovascular: Negative for chest pain and palpitations.  Gastrointestinal: Positive for nausea, vomiting and abdominal pain. Negative for heartburn, diarrhea, blood in stool and melena.  Genitourinary: Positive for flank pain. Negative for hematuria.  Musculoskeletal: Positive for joint pain.       Chronic knee pain  Skin: Negative.   Neurological: Negative.  Negative for weakness.  Endo/Heme/Allergies: Negative.   Psychiatric/Behavioral: Negative.      Past Medical History:   Past Medical History  Diagnosis Date  . Hypertension   . Diabetes mellitus   . Renal disorder   .  Nephrolithiasis   . Hyperlipidemia associated with type 2 diabetes mellitus [facility])     Past Surgical History:   Past Surgical History  Procedure Laterality Date  . Knee arthroscopy Left   . Lumbar fusion      L4-L5    Social History:   Social History   Social History  . Marital Status: Single    Spouse Name: N/A  . Number of Children: N/A  . Years of Education: N/A   Occupational History  . Lawyer    Social History Main Topics  . Smoking status: Never Smoker   . Smokeless tobacco: Not on file  . Alcohol Use: 0.0 oz/week    0 Standard drinks or equivalent per week     Comment: Occasionally/Social  . Drug Use: No  . Sexual Activity: Not on file   Other Topics Concern  . Not on file   Social History Narrative   Divorced. Lives alone.  Independent of ADLs. Ambulates with a cane.    Family history:   Family History  Problem Relation Age of Onset  . Dementia Mother     Allergies   Chocolate  Current Medications:   Prior to Admission medications   Not on File    Physical Exam:   Filed Vitals:   09/18/15 0349 09/18/15 0608 09/18/15 0759 09/18/15 0855  BP: 99/57 91/64 112/64 125/69  Pulse: 72 73 70 66  Temp: 98 F (36.7 C) 98.7 F (37.1 C) 98.2 F (36.8 C) 97.7 F (36.5 C)  TempSrc: Oral Oral Oral Oral  Resp: 20 18 18    Height:  6' (1.829 m)  Weight:    105.325 kg (232 lb 3.2 oz)  SpO2: 94% 98% 99% 99%     Physical Exam: Blood pressure 125/69, pulse 66, temperature 97.7 F (36.5 C), temperature source Oral, resp. rate 18, height 6' (1.829 m), weight 105.325 kg (232 lb 3.2 oz), SpO2 99 %. Gen: No acute distress. Head: Normocephalic, atraumatic. Eyes: PERRL, EOMI, sclerae nonicteric. Mouth: Oropharynx clear. Neck: Supple, no thyromegaly, no lymphadenopathy, no jugular venous distention. Chest: Lungs clear to auscultation bilaterally. CV: Heart sounds are regular. No murmurs, rubs, or gallops. Abdomen: Soft, nontender, nondistended with  normal active bowel sounds. Extremities: Extremities without clubbing, edema, or cyanosis. Skin: Warm and dry. Neuro: Alert and oriented times 3; cranial nerves II through XII grossly intact. Psych: Mood and affect normal.   Data Review:    Labs: Basic Metabolic Panel:  Recent Labs Lab 09/18/15 0342  NA 139  K 4.4  CL 105  CO2 22  GLUCOSE 142*  BUN 33*  CREATININE 1.33*  CALCIUM 9.1   Liver Function Tests:  Recent Labs Lab 09/18/15 0342  AST 116*  ALT 186*  ALKPHOS 617*  BILITOT 5.5*  PROT 6.2*  ALBUMIN 3.0*   CBC:  Recent Labs Lab 09/18/15 0342  WBC 17.4*  NEUTROABS 15.3*  HGB 13.3  HCT 40.4  MCV 94.0  PLT 272   CBG:  Recent Labs Lab 09/18/15 1149  GLUCAP 121*    Radiographic Studies: Ct Renal Stone Study  09/18/2015  CLINICAL DATA:  Right flank pain, nausea, and vomiting with sudden onset last night. History of kidney stones. EXAM: CT ABDOMEN AND PELVIS WITHOUT CONTRAST TECHNIQUE: Multidetector CT imaging of the abdomen and pelvis was performed following the standard protocol without IV contrast. COMPARISON:  06/28/2009 FINDINGS: Atelectasis in the lung bases. 15 mm stone in the lower pole right kidney. No hydronephrosis or hydroureter on either side. No ureteral stones or bladder stones identified. Bladder wall is not thickened. There is a stone in the distal common bile duct with prominent dilatation of the intra and extrahepatic bile ducts. Several gallstones are also present. No obvious gallbladder wall thickening. Fatty infiltration of the pancreas. The spleen, adrenal glands, abdominal aorta, inferior vena cava, and retroperitoneal lymph nodes are unremarkable. Small esophageal hiatal hernia. Stomach, small bowel, and colon are not abnormally distended. Scattered diverticula in the colon. No free air or free fluid in the abdomen. Abdominal wall musculature appears intact. Prominent visceral adipose tissues. Pelvis: Fat in the inguinal canals  bilaterally. The appendix is not identified. Scattered diverticula in the sigmoid colon without evidence of diverticulitis. Prostate gland is enlarged, measuring 5.1 cm diameter. Prostatic impression upon the bladder base. Bladder wall is not thickened. The no free or loculated pelvic fluid collections. Degenerative changes in the spine. No destructive bone lesions. IMPRESSION: Nonobstructing stone in the lower pole right kidney. There is an obstructing stone in the distal common bile duct with proximal intra and extrahepatic bile duct dilatation. Cholelithiasis. Enlarged prostate gland. Electronically Signed   By: Lucienne Capers M.D.   On: 09/18/2015 02:02   US Abdomen Limited Ruq  09/18/2015  CLINICAL DATA:  Obstructing stone at the distal common bile duct on CT. Further evaluation requested. Initial encounter. EXAM: US ABDOMEN LIMITED - RIGHT UPPER QUADRANT COMPARISON:  CT of the abdomen and pelvis performed earlier today at 1:51 a.m. FINDINGS: Gallbladder: Stones are again noted within the gallbladder, measuring up to 2.2 cm in size. No definite gallbladder wall thickening or pericholecystic  fluid is seen. No ultrasonographic Murphy's sign is elicited. Common bile duct: Diameter: 1.1 cm; dilated, as noted on CT, reflecting the distal obstructing stone. The known obstructing stone is not seen on this study due to overlying bowel gas. Liver: No focal lesion identified. Intrahepatic biliary duct dilatation is better characterized on recent CT. Within normal limits in parenchymal echogenicity. IMPRESSION: 1. Dilatation of the common bile duct and intrahepatic biliary ductal dilatation, reflecting the obstructing stone at the distal common bile duct as seen on CT. The known obstructing stone is not characterized on this study due to overlying bowel gas. 2. Underlying cholelithiasis.  No evidence for cholecystitis. Electronically Signed   By: Garald Balding M.D.   On: 09/18/2015 06:36   *I have personally  reviewed the images above*  EKG: No EKG done.   Assessment/Plan:   Principal Problem:   Choledocholithiasis with acute cholecystitis with obstruction - Admit and continue empiric Zosyn. - GI consult for ERCP. - Monitor LFTs. - Provide pain control.  Active Problems:   Hypertension - Continue lisinopril.    Nephrolithiasis - Continue Avodart.    Hyperlipidemia associated with type 2 diabetes mellitus (HCC) - Continue Crestor.    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 Current creatinine slightly elevated with a GFR of 51. 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 - Hold Actos for now. SSI, insulin sensitive scale every 4 hours while nothing by mouth.    DVT prophylaxis - SCDs ordered. Hold Lovenox for now.  Code Status / Family Communication / Disposition Plan:   Code Status: Full. Family Communication: Teena Dunk [phone], friend. Disposition Plan: Home when stable, likely 09/20/15.  Attestation regarding necessity of inpatient status:   The appropriate admission status for this patient is INPATIENT. Inpatient status is judged to be reasonable and necessary in order to provide the required intensity of service to ensure the patient's safety. The patient's presenting symptoms, physical exam findings, and initial radiographic and laboratory data in the context of their chronic comorbidities is felt to place them at high risk for further clinical deterioration. Furthermore, it is not anticipated that the patient will be medically stable for discharge from the hospital within 2 midnights of admission. The following factors support the admission status of inpatient.   -The patient's presenting symptoms include abdominal pain with nausea and vomiting. - The worrisome physical exam findings include right upper quadrant/flank pain. - The initial radiographic and laboratory data are worrisome because of choledocholithiasis with  elevated LFTs and WBC. - The chronic co-morbidities include diabetes, hypertension. - Patient requires inpatient status due to high intensity of service, high risk for further deterioration and high frequency of surveillance required. - I certify that at the point of admission it is my clinical judgment that the patient will require inpatient hospital care spanning beyond 2 midnights from the point of admission.   Time spent: 70 minutes.  RAMA,CHRISTINA [facility] Pager [phone] Cell: [phone]   If 7PM-7AM, please contact night-coverage [email or web address] Password [facility] 09/18/2015, 12:13 PM

## 2015-09-18 NOTE — Progress Notes (Signed)
ANTIBIOTIC CONSULT NOTE - INITIAL  Pharmacy Consult for piperacillin/tazobactam Indication: intra-abdominal  Allergies  Allergen Reactions  . Chocolate     Throat breaks out    Patient Measurements:   Adjusted Body Weight:   Vital Signs: Temp: 98.7 F (37.1 C) (01/28 0608) Temp Source: Oral (01/28 [id number]) BP: 91/64 mmHg (01/28 [id number]) Pulse Rate: 73 (01/28 0608) Intake/Output from previous day:   Intake/Output from this shift:    Labs:  Recent Labs  09/18/15 0342  WBC 17.4*  HGB 13.3  PLT 272  CREATININE 1.33*   CrCl cannot be calculated (Unknown ideal weight.). No results for input(s): VANCOTROUGH, VANCOPEAK, VANCORANDOM, GENTTROUGH, GENTPEAK, GENTRANDOM, TOBRATROUGH, TOBRAPEAK, TOBRARND, AMIKACINPEAK, AMIKACINTROU, AMIKACIN in the last 72 hours.   Microbiology: No results found for this or any previous visit (from the past 720 hour(s)).  Medical History: Past Medical History  Diagnosis Date  . Hypertension   . Diabetes mellitus   . Renal disorder    Assessment: 52 YOM presents with flank pain.  He has h/o renal stones,  CT reveals non-obstructing renal stone in Lower pole of R kidney.  Also reveals obstruction in distal common bile duct. Pharmacy asked to dose pip/tazo for possible intra-abdominal infection.  LFTs elevated, WBC increased.  SCr also elevated  Goal of Therapy:  Dose for indication and for patient-specific parameters  Plan:   Zosyn 3.375gm IV over 17min x 1 in ED then 3.375gm IV q8h over 4h infusion  Doreene Eland, PharmD, BCPS.   Pager: [phone] 09/18/2015 7:22 AM

## 2015-09-18 NOTE — Consult Note (Signed)
Referring Provider:  Dr. Altha Harm Rama Primary Care Physician:  Sheela Stack, MD Primary Gastroenterologist:  Dr. Earlean Shawl  Reason for Consultation:  Symptomatic choledocholithiasis  HPI: Gary Roach is a 75 y.o. male admitted through the emergency room this morning and found to have choledocholithiasis.  The patient has no family history of biliary tract disease. He had abdominal pain and chills approximately 3 days prior to admission, which resolved spontaneously. No history of dark urine.  The night before admission, he had the onset of right flank pain that then became bright anterior abdominal pain, as well as some mild chills but no fevers. He presented to the emergency room, where he was found to have significant elevation of liver chemistries, with bilirubin of 5.5 and transaminases in the 100-200 range, alkaline phosphatase over 600. White count was elevated at 17,000, 400, but he was afebrile.  A CT of the abdomen showed a distal common duct stone as well as both intrahepatic and extrahepatic biliary ductal dilatation.  A subsequent ultrasound has shown that he also has cholelithiasis.  At the present time, the patient is pain-free.  The patient indicates that he had a colonoscopy, apparently negative, by Dr. Earlie Raveling about 10 years ago and recently received a letter recommending a follow-up examination, which he has declined to pursue.    Past Medical History  Diagnosis Date  . Hypertension   . Diabetes mellitus   . Renal disorder   . Nephrolithiasis   . Hyperlipidemia associated with type 2 diabetes mellitus [facility])     Past Surgical History  Procedure Laterality Date  . Knee arthroscopy Left   . Lumbar fusion      L4-L5    Prior to Admission medications   Medication Sig Start Date End Date Taking? Authorizing Provider  ALPRAZolam Duanne Moron) 0.5 MG tablet Take 0.5 mg by mouth 2 (two) times daily as needed for anxiety.    Yes Historical Provider, MD   aspirin EC 81 MG tablet Take 81 mg by mouth daily.   Yes Historical Provider, MD  dutasteride (AVODART) 0.5 MG capsule Take 0.5 mg by mouth daily.   Yes Historical Provider, MD  ergocalciferol (VITAMIN D2) 50000 units capsule Take 50,000 Units by mouth once a week.   Yes Historical Provider, MD  lisinopril (PRINIVIL,ZESTRIL) 40 MG tablet Take 40 mg by mouth daily.   Yes Historical Provider, MD  pioglitazone (ACTOS) 15 MG tablet Take 15 mg by mouth daily.   Yes Historical Provider, MD  rosuvastatin (CRESTOR) 10 MG tablet Take 10 mg by mouth daily.   Yes Historical Provider, MD  traMADol (ULTRAM) 50 MG tablet Take 50 mg by mouth every 12 (twelve) hours as needed for moderate pain.   Yes Historical Provider, MD  triamcinolone cream (KENALOG) 0.1 % Apply 1 application topically 2 (two) times daily as needed (itching).    Yes Historical Provider, MD  predniSONE (DELTASONE) 5 MG tablet Take 5-30 mg by mouth as directed. Reported on 09/18/2015    Historical Provider, MD    Current Facility-Administered Medications  Medication Dose Route Frequency Provider Last Rate Last Dose  . acetaminophen (TYLENOL) tablet 650 mg  650 mg Oral Q6H PRN Venetia Maxon Rama, MD       Or  . acetaminophen (TYLENOL) suppository 650 mg  650 mg Rectal Q6H PRN Venetia Maxon Rama, MD      . ALPRAZolam Duanne Moron) tablet 0.5 mg  0.5 mg Oral TID PRN Venetia Maxon Rama, MD   0.5 mg at  09/18/15 2103  . alum & mag hydroxide-simeth (MAALOX/MYLANTA) 200-200-20 MG/5ML suspension 30 mL  30 mL Oral Q6H PRN Christina P Rama, MD      . antiseptic oral rinse (CPC / CETYLPYRIDINIUM CHLORIDE 0.05%) solution 7 mL  7 mL Mouth Rinse q12n4p Venetia Maxon Rama, MD   7 mL at 09/18/15 1701  . chlorhexidine (PERIDEX) 0.12 % solution 15 mL  15 mL Mouth Rinse BID Venetia Maxon Rama, MD   15 mL at 09/18/15 2102  . dextrose 5 %-0.9 % sodium chloride infusion   Intravenous Continuous Fransico Meadow, PA-C 75 mL/hr at 09/18/15 2058    . dutasteride (AVODART) capsule 0.5  mg  0.5 mg Oral Daily Venetia Maxon Rama, MD   0.5 mg at 09/18/15 1116  . insulin aspart (novoLOG) injection 0-9 Units  0-9 Units Subcutaneous 6 times per day Venetia Maxon Rama, MD   1 Units at 09/18/15 1205  . lisinopril (PRINIVIL,ZESTRIL) tablet 40 mg  40 mg Oral Daily Venetia Maxon Rama, MD   40 mg at 09/18/15 1116  . morphine 4 MG/ML injection 4 mg  4 mg Intravenous Q3H PRN Christina P Rama, MD      . ondansetron (ZOFRAN) tablet 4 mg  4 mg Oral Q6H PRN Venetia Maxon Rama, MD       Or  . ondansetron (ZOFRAN) injection 4 mg  4 mg Intravenous Q6H PRN Christina P Rama, MD      . oxyCODONE (Oxy IR/ROXICODONE) immediate release tablet 5 mg  5 mg Oral Q4H PRN Christina P Rama, MD      . piperacillin-tazobactam (ZOSYN) IVPB 3.375 g  3.375 g Intravenous 3 times per day Berton Mount, RPH   3.375 g at 09/18/15 2100  . rosuvastatin (CRESTOR) tablet 10 mg  10 mg Oral q1800 Venetia Maxon Rama, MD   10 mg at 09/18/15 1701    Allergies as of 09/18/2015 - Review Complete 09/18/2015  Allergen Reaction Noted  . Chocolate  09/18/2015    Family History  Problem Relation Age of Onset  . Dementia Mother     Social History   Social History  . Marital Status: Single    Spouse Name: N/A  . Number of Children: N/A  . Years of Education: N/A   Occupational History  . Lawyer    Social History Main Topics  . Smoking status: Never Smoker   . Smokeless tobacco: Not on file  . Alcohol Use: 0.0 oz/week    0 Standard drinks or equivalent per week     Comment: Occasionally/Social  . Drug Use: No  . Sexual Activity: Not on file   Other Topics Concern  . Not on file   Social History Narrative   Divorced. Lives alone.  Independent of ADLs. Ambulates with a cane.    Review of Systems:  negative for chest pain, exertional dyspnea, or urinary symptoms   Physical Exam: Vital signs in last 24 hours: Temp:  [97.7 F (36.5 C)-98.7 F (37.1 C)] 98 F (36.7 C) (01/28 2111) Pulse Rate:  [58-86] 58 (01/28  2111) Resp:  [16-20] 16 (01/28 2111) BP: (91-125)/(57-69) 117/69 mmHg (01/28 2111) SpO2:  [93 %-99 %] 97 % (01/28 2111) Weight:  [105.325 kg (232 lb 3.2 oz)] 105.325 kg (232 lb 3.2 oz) (01/28 0855) Last BM Date: 09/18/15 General:   Alert,  Well-developed, well-nourished, pleasant and cooperative in NAD Head:  Normocephalic and atraumatic. Eyes:  Sclera clear, no icterus.   Conjunctiva pink. Mouth:  No ulcerations or lesions.  Oropharynx pink & moist. Neck:   No masses or thyromegaly. Lungs:  Clear throughout to auscultation.   No wheezes, crackles, or rhonchi. No evident respiratory distress. Heart:   Regular rate and rhythm; no murmurs, clicks, rubs,  or gallops. Abdomen:  Soft, nontender, nontympanitic, and nondistended. No masses, hepatosplenomegaly or ventral hernias noted. Quiet bowel sounds, without bruits, guarding, or rebound.   Rectal:  Not performed    Msk:   Symmetrical without gross deformities. Pulses:  Normal radial pulse noted. Extremities:   Without clubbing, cyanosis, or edema. Neurologic:  Alert and coherent;  grossly normal neurologically. Skin:  Intact without significant lesions or rashes. Cervical Nodes:  No significant cervical adenopathy. Psych:   Alert and cooperative. Normal mood and affect.  Intake/Output from previous day:   Intake/Output this shift:    Lab Results:  Recent Labs  09/18/15 0342  WBC 17.4*  HGB 13.3  HCT 40.4  PLT 272   BMET  Recent Labs  09/18/15 0342  NA 139  K 4.4  CL 105  CO2 22  GLUCOSE 142*  BUN 33*  CREATININE 1.33*  CALCIUM 9.1   LFT  Recent Labs  09/18/15 0342  PROT 6.2*  ALBUMIN 3.0*  AST 116*  ALT 186*  ALKPHOS 617*  BILITOT 5.5*   PT/INR No results for input(s): LABPROT, INR in the last 72 hours.  Studies/Results: Ct Renal Stone Study  09/18/2015  CLINICAL DATA:  Right flank pain, nausea, and vomiting with sudden onset last night. History of kidney stones. EXAM: CT ABDOMEN AND PELVIS WITHOUT  CONTRAST TECHNIQUE: Multidetector CT imaging of the abdomen and pelvis was performed following the standard protocol without IV contrast. COMPARISON:  06/28/2009 FINDINGS: Atelectasis in the lung bases. 15 mm stone in the lower pole right kidney. No hydronephrosis or hydroureter on either side. No ureteral stones or bladder stones identified. Bladder wall is not thickened. There is a stone in the distal common bile duct with prominent dilatation of the intra and extrahepatic bile ducts. Several gallstones are also present. No obvious gallbladder wall thickening. Fatty infiltration of the pancreas. The spleen, adrenal glands, abdominal aorta, inferior vena cava, and retroperitoneal lymph nodes are unremarkable. Small esophageal hiatal hernia. Stomach, small bowel, and colon are not abnormally distended. Scattered diverticula in the colon. No free air or free fluid in the abdomen. Abdominal wall musculature appears intact. Prominent visceral adipose tissues. Pelvis: Fat in the inguinal canals bilaterally. The appendix is not identified. Scattered diverticula in the sigmoid colon without evidence of diverticulitis. Prostate gland is enlarged, measuring 5.1 cm diameter. Prostatic impression upon the bladder base. Bladder wall is not thickened. The no free or loculated pelvic fluid collections. Degenerative changes in the spine. No destructive bone lesions. IMPRESSION: Nonobstructing stone in the lower pole right kidney. There is an obstructing stone in the distal common bile duct with proximal intra and extrahepatic bile duct dilatation. Cholelithiasis. Enlarged prostate gland. Electronically Signed   By: Lucienne Capers M.D.   On: 09/18/2015 02:02   US Abdomen Limited Ruq  09/18/2015  CLINICAL DATA:  Obstructing stone at the distal common bile duct on CT. Further evaluation requested. Initial encounter. EXAM: US ABDOMEN LIMITED - RIGHT UPPER QUADRANT COMPARISON:  CT of the abdomen and pelvis performed earlier today  at 1:51 a.m. FINDINGS: Gallbladder: Stones are again noted within the gallbladder, measuring up to 2.2 cm in size. No definite gallbladder wall thickening or pericholecystic fluid is seen. No ultrasonographic Murphy's sign  is elicited. Common bile duct: Diameter: 1.1 cm; dilated, as noted on CT, reflecting the distal obstructing stone. The known obstructing stone is not seen on this study due to overlying bowel gas. Liver: No focal lesion identified. Intrahepatic biliary duct dilatation is better characterized on recent CT. Within normal limits in parenchymal echogenicity. IMPRESSION: 1. Dilatation of the common bile duct and intrahepatic biliary ductal dilatation, reflecting the obstructing stone at the distal common bile duct as seen on CT. The known obstructing stone is not characterized on this study due to overlying bowel gas. 2. Underlying cholelithiasis.  No evidence for cholecystitis. Electronically Signed   By: Garald Balding M.D.   On: 09/18/2015 06:36    Impression: 1. Symptomatic choledocholithiasis with possible early cholangitis but no evidence of frank septic cholangitis. 2. Cholelithiasis 3. Overdue for colon cancer screening   Plan: 1. ERCP with sphincterotomy and stone extraction tomorrow. No need for urgent ERCP, and in fact, it might actually be safer after he has had some antibiotic therapy, in view of his elevated white count. Moreover, since he has already received Lovenox today, it would be preferable to let that get out of his system before doing a sphincterotomy.   I have very carefully and thoroughly explained the nature, purpose, risks, and alternatives of ERCP and sphincterotomy and stone extraction with the patient, with the help of an illustration, and he has a good understanding and no further questions. Risks discussed included roughly a 3% risk of pancreatitis which can range from mild to fatal in severity, anesthesia problems, perforation, bleeding, and infection.  2.  I have encouraged the patient to reconsider having a screening colonoscopy, and explained the rationale for doing so.  3. The patient will need his gallbladder out. He prefers to go home after his ERCP, and have arrangements for his cholecystectomy as an outpatient.  4. Time spent at the bedside, including care coordination, was approximately 50 minutes, more than 50% counseling the patient regarding the nature of his condition and the options and risks regarding its management.   1 LOS: 0 days   Milley Vining,[place] V  09/18/2015, 10:06 PM   Pager [phone] If no answer or after 5 PM call [phone]

## 2015-09-18 NOTE — ED Notes (Signed)
Rama at bedside; will transport pt to floor with exam completion.

## 2015-09-18 NOTE — ED Provider Notes (Signed)
CSN: [id number]     Arrival date & time 09/18/15  0050 History   First MD Initiated Contact with Patient 09/18/15 0100     Chief Complaint  Patient presents with  . Flank Pain     (Consider location/radiation/quality/duration/timing/severity/associated sxs/prior Treatment) HPI Comments: 75 year old male with Hx kidney stones had acute onset R flank pain   Denies N/V. Called EMS who administered IV Fentanyl with relief of pain . Denies hematuria, chills.   Patient is a 75 y.o. male presenting with flank pain. The history is provided by the patient.  Flank Pain This is a recurrent problem. The current episode started today. The problem occurs intermittently. The problem has been rapidly improving. Pertinent negatives include no chills, fever or urinary symptoms. Nothing aggravates the symptoms. Treatments tried: Fentynl.    Past Medical History  Diagnosis Date  . Hypertension   . Diabetes mellitus   . Renal disorder   . Nephrolithiasis   . Hyperlipidemia associated with type 2 diabetes mellitus [facility])    Past Surgical History  Procedure Laterality Date  . Knee arthroscopy Left   . Lumbar fusion      L4-L5   Family History  Problem Relation Age of Onset  . Dementia Mother    Social History  Substance Use Topics  . Smoking status: Never Smoker   . Smokeless tobacco: None  . Alcohol Use: 0.0 oz/week    0 Standard drinks or equivalent per week     Comment: Occasionally/Social    Review of Systems  Constitutional: Negative for fever and chills.  Genitourinary: Positive for flank pain. Negative for hematuria.  All other systems reviewed and are negative.     Allergies  Chocolate  Home Medications   Prior to Admission medications   Medication Sig Start Date End Date Taking? Authorizing Provider  ALPRAZolam Duanne Moron) 0.5 MG tablet Take 0.5 mg by mouth 2 (two) times daily as needed for anxiety.    Yes Historical Provider, MD  aspirin EC 81 MG tablet Take 81 mg by mouth  daily.   Yes Historical Provider, MD  dutasteride (AVODART) 0.5 MG capsule Take 0.5 mg by mouth daily.   Yes Historical Provider, MD  ergocalciferol (VITAMIN D2) 50000 units capsule Take 50,000 Units by mouth once a week.   Yes Historical Provider, MD  lisinopril (PRINIVIL,ZESTRIL) 40 MG tablet Take 40 mg by mouth daily.   Yes Historical Provider, MD  pioglitazone (ACTOS) 15 MG tablet Take 15 mg by mouth daily.   Yes Historical Provider, MD  rosuvastatin (CRESTOR) 10 MG tablet Take 10 mg by mouth daily.   Yes Historical Provider, MD  traMADol (ULTRAM) 50 MG tablet Take 50 mg by mouth every 12 (twelve) hours as needed for moderate pain.   Yes Historical Provider, MD  triamcinolone cream (KENALOG) 0.1 % Apply 1 application topically 2 (two) times daily as needed (itching).    Yes Historical Provider, MD  predniSONE (DELTASONE) 5 MG tablet Take 5-30 mg by mouth as directed. Reported on 09/18/2015    Historical Provider, MD   BP 124/64 mmHg  Pulse 58  Temp(Src) 98.4 F (36.9 C) (Oral)  Resp 18  Ht 6' (1.829 m)  Wt 105.325 kg  BMI 31.49 kg/m2  SpO2 98% Physical Exam  Constitutional: He appears well-developed and well-nourished.  HENT:  Head: Normocephalic.  Eyes: Pupils are equal, round, and reactive to light.  Cardiovascular: Normal rate.   Pulmonary/Chest: Effort normal.  Abdominal: Soft.  Musculoskeletal: Normal range of  motion.  Neurological: He is alert.  Skin: Skin is warm and dry. No erythema.  Nursing note and vitals reviewed.   ED Course  Procedures (including critical care time) Labs Review Labs Reviewed  URINALYSIS, ROUTINE W REFLEX MICROSCOPIC (NOT AT [facility]) - Abnormal; Notable for the following:    APPearance CLOUDY (*)    Hgb urine dipstick TRACE (*)    Bilirubin Urine SMALL (*)    All other components within normal limits  URINE MICROSCOPIC-ADD ON - Abnormal; Notable for the following:    Squamous Epithelial / LPF 0-5 (*)    Bacteria, UA RARE (*)    Casts GRANULAR  CAST (*)    All other components within normal limits  CBC WITH DIFFERENTIAL/PLATELET - Abnormal; Notable for the following:    WBC 17.4 (*)    Neutro Abs 15.3 (*)    All other components within normal limits  COMPREHENSIVE METABOLIC PANEL - Abnormal; Notable for the following:    Glucose, Bld 142 (*)    BUN 33 (*)    Creatinine, Ser 1.33 (*)    Total Protein 6.2 (*)    Albumin 3.0 (*)    AST 116 (*)    ALT 186 (*)    Alkaline Phosphatase 617 (*)    Total Bilirubin 5.5 (*)    GFR calc non Af Amer 51 (*)    GFR calc Af Amer 59 (*)    All other components within normal limits  GLUCOSE, CAPILLARY - Abnormal; Notable for the following:    Glucose-Capillary 121 (*)    All other components within normal limits  GLUCOSE, CAPILLARY  COMPREHENSIVE METABOLIC PANEL  CBC  TYPE AND SCREEN    Imaging Review Ct Renal Stone Study  09/18/2015  CLINICAL DATA:  Right flank pain, nausea, and vomiting with sudden onset last night. History of kidney stones. EXAM: CT ABDOMEN AND PELVIS WITHOUT CONTRAST TECHNIQUE: Multidetector CT imaging of the abdomen and pelvis was performed following the standard protocol without IV contrast. COMPARISON:  06/28/2009 FINDINGS: Atelectasis in the lung bases. 15 mm stone in the lower pole right kidney. No hydronephrosis or hydroureter on either side. No ureteral stones or bladder stones identified. Bladder wall is not thickened. There is a stone in the distal common bile duct with prominent dilatation of the intra and extrahepatic bile ducts. Several gallstones are also present. No obvious gallbladder wall thickening. Fatty infiltration of the pancreas. The spleen, adrenal glands, abdominal aorta, inferior vena cava, and retroperitoneal lymph nodes are unremarkable. Small esophageal hiatal hernia. Stomach, small bowel, and colon are not abnormally distended. Scattered diverticula in the colon. No free air or free fluid in the abdomen. Abdominal wall musculature appears  intact. Prominent visceral adipose tissues. Pelvis: Fat in the inguinal canals bilaterally. The appendix is not identified. Scattered diverticula in the sigmoid colon without evidence of diverticulitis. Prostate gland is enlarged, measuring 5.1 cm diameter. Prostatic impression upon the bladder base. Bladder wall is not thickened. The no free or loculated pelvic fluid collections. Degenerative changes in the spine. No destructive bone lesions. IMPRESSION: Nonobstructing stone in the lower pole right kidney. There is an obstructing stone in the distal common bile duct with proximal intra and extrahepatic bile duct dilatation. Cholelithiasis. Enlarged prostate gland. Electronically Signed   By: Lucienne Capers M.D.   On: 09/18/2015 02:02   US Abdomen Limited Ruq  09/18/2015  CLINICAL DATA:  Obstructing stone at the distal common bile duct on CT. Further evaluation requested. Initial encounter. EXAM:  US ABDOMEN LIMITED - RIGHT UPPER QUADRANT COMPARISON:  CT of the abdomen and pelvis performed earlier today at 1:51 a.m. FINDINGS: Gallbladder: Stones are again noted within the gallbladder, measuring up to 2.2 cm in size. No definite gallbladder wall thickening or pericholecystic fluid is seen. No ultrasonographic Murphy's sign is elicited. Common bile duct: Diameter: 1.1 cm; dilated, as noted on CT, reflecting the distal obstructing stone. The known obstructing stone is not seen on this study due to overlying bowel gas. Liver: No focal lesion identified. Intrahepatic biliary duct dilatation is better characterized on recent CT. Within normal limits in parenchymal echogenicity. IMPRESSION: 1. Dilatation of the common bile duct and intrahepatic biliary ductal dilatation, reflecting the obstructing stone at the distal common bile duct as seen on CT. The known obstructing stone is not characterized on this study due to overlying bowel gas. 2. Underlying cholelithiasis.  No evidence for cholecystitis. Electronically  Signed   By: Garald Balding M.D.   On: 09/18/2015 06:36   I have personally reviewed and evaluated these images and lab results as part of my medical decision-making.   EKG Interpretation None      MDM   Final diagnoses:  Right flank pain         Junius Creamer, NP 09/18/15 1950  Merryl Hacker, MD 09/18/15 2253

## 2015-09-19 ENCOUNTER — Encounter (HOSPITAL_COMMUNITY): Payer: Self-pay | Admitting: Certified Registered Nurse Anesthetist

## 2015-09-19 ENCOUNTER — Encounter (HOSPITAL_COMMUNITY)
Admission: EM | Disposition: A | Discharge status: Home / Self Care | Payer: Self-pay | Source: Home / Self Care | Attending: Internal Medicine

## 2015-09-19 ENCOUNTER — Inpatient Hospital Stay (HOSPITAL_COMMUNITY): Payer: Medicare Other

## 2015-09-19 ENCOUNTER — Inpatient Hospital Stay (HOSPITAL_COMMUNITY): Payer: Medicare Other | Admitting: Anesthesiology

## 2015-09-19 DIAGNOSIS — K8050 Calculus of bile duct without cholangitis or cholecystitis without obstruction: Secondary | ICD-10-CM

## 2015-09-19 DIAGNOSIS — K2980 Duodenitis without bleeding: Secondary | ICD-10-CM | POA: Diagnosis present

## 2015-09-19 DIAGNOSIS — R945 Abnormal results of liver function studies: Secondary | ICD-10-CM

## 2015-09-19 DIAGNOSIS — R7989 Other specified abnormal findings of blood chemistry: Secondary | ICD-10-CM

## 2015-09-19 DIAGNOSIS — N179 Acute kidney failure, unspecified: Secondary | ICD-10-CM | POA: Diagnosis present

## 2015-09-19 DIAGNOSIS — K8051 Calculus of bile duct without cholangitis or cholecystitis with obstruction: Secondary | ICD-10-CM | POA: Diagnosis present

## 2015-09-19 DIAGNOSIS — R109 Unspecified abdominal pain: Secondary | ICD-10-CM

## 2015-09-19 DIAGNOSIS — K8033 Calculus of bile duct with acute cholangitis with obstruction: Secondary | ICD-10-CM

## 2015-09-19 LAB — COMPREHENSIVE METABOLIC PANEL
ALBUMIN: 2.6 g/dL — AB (ref 3.5–5.0)
ALT: 154 U/L — ABNORMAL HIGH (ref 17–63)
ANION GAP: 8 (ref 5–15)
AST: 116 U/L — AB (ref 15–41)
Alkaline Phosphatase: 533 U/L — ABNORMAL HIGH (ref 38–126)
BILIRUBIN TOTAL: 6.2 mg/dL — AB (ref 0.3–1.2)
BUN: 21 mg/dL — AB (ref 6–20)
CO2: 23 mmol/L (ref 22–32)
Calcium: 8.6 mg/dL — ABNORMAL LOW (ref 8.9–10.3)
Chloride: 107 mmol/L (ref 101–111)
Creatinine, Ser: 1.08 mg/dL (ref 0.61–1.24)
GFR calc Af Amer: >60 mL/min (ref >60)
GFR calc non Af Amer: >60 mL/min (ref >60)
GLUCOSE: 117 mg/dL — AB (ref 65–99)
Potassium: 3.9 mmol/L (ref 3.5–5.1)
SODIUM: 138 mmol/L (ref 135–145)
TOTAL PROTEIN: 5.7 g/dL — AB (ref 6.5–8.1)

## 2015-09-19 LAB — CBC
HEMATOCRIT: 38.3 % — AB (ref 39.0–52.0)
HEMOGLOBIN: 12.6 g/dL — AB (ref 13.0–17.0)
MCH: 31 pg (ref 26.0–34.0)
MCHC: 32.9 g/dL (ref 30.0–36.0)
MCV: 94.3 fL (ref 78.0–100.0)
Platelets: 263 10*3/uL (ref 150–400)
RBC: 4.06 MIL/uL — ABNORMAL LOW (ref 4.22–5.81)
RDW: 15.5 % (ref 11.5–15.5)
WBC: 6.7 10*3/uL (ref 4.0–10.5)

## 2015-09-19 LAB — GLUCOSE, CAPILLARY
GLUCOSE-CAPILLARY: 101 mg/dL — AB (ref 65–99)
GLUCOSE-CAPILLARY: 117 mg/dL — AB (ref 65–99)
Glucose-Capillary: 100 mg/dL — ABNORMAL HIGH (ref 65–99)
Glucose-Capillary: 129 mg/dL — ABNORMAL HIGH (ref 65–99)
Glucose-Capillary: 168 mg/dL — ABNORMAL HIGH (ref 65–99)
Glucose-Capillary: 190 mg/dL — ABNORMAL HIGH (ref 65–99)
Glucose-Capillary: 197 mg/dL — ABNORMAL HIGH (ref 65–99)

## 2015-09-19 LAB — ABO/RH: ABO/RH(D): A POS

## 2015-09-19 LAB — TYPE AND SCREEN
ABO/RH(D): A POS
Antibody Screen: NEGATIVE

## 2015-09-19 SURGERY — ERCP, WITH INTERVENTION IF INDICATED
Anesthesia: General

## 2015-09-19 MED ORDER — GLUCAGON HCL RDNA (DIAGNOSTIC) 1 MG IJ SOLR
INTRAMUSCULAR | Status: AC
  Filled 2015-09-19: qty 1

## 2015-09-19 MED ORDER — FENTANYL CITRATE (PF) 100 MCG/2ML IJ SOLN
INTRAMUSCULAR | Status: AC
  Filled 2015-09-19: qty 2

## 2015-09-19 MED ORDER — LACTATED RINGERS IV SOLN
INTRAVENOUS | Status: DC | PRN
  Administered 2015-09-19: 08:00:00 via INTRAVENOUS

## 2015-09-19 MED ORDER — ONDANSETRON HCL 4 MG/2ML IJ SOLN
INTRAMUSCULAR | Status: AC
  Filled 2015-09-19: qty 2

## 2015-09-19 MED ORDER — SODIUM CHLORIDE 0.9 % IV SOLN
INTRAVENOUS | Status: DC | PRN
  Administered 2015-09-19: 35 mL

## 2015-09-19 MED ORDER — FENTANYL CITRATE (PF) 100 MCG/2ML IJ SOLN: 25.0000 ug | INTRAMUSCULAR | Status: DC | PRN

## 2015-09-19 MED ORDER — PANTOPRAZOLE SODIUM 40 MG PO TBEC
40.0000 mg | DELAYED_RELEASE_TABLET | Freq: Two times a day (BID) | ORAL | Status: DC
  Administered 2015-09-19 – 2015-09-24 (×10): 40 mg via ORAL
  Filled 2015-09-19 (×15): qty 1

## 2015-09-19 MED ORDER — INDOMETHACIN 50 MG RE SUPP
RECTAL | Status: AC
  Filled 2015-09-19: qty 2

## 2015-09-19 MED ORDER — MIDAZOLAM HCL 2 MG/2ML IJ SOLN
INTRAMUSCULAR | Status: DC | PRN
Start: 2015-09-19 — End: 2015-09-19
  Administered 2015-09-19: 2 mg via INTRAVENOUS

## 2015-09-19 MED ORDER — ONDANSETRON HCL 4 MG/2ML IJ SOLN
INTRAMUSCULAR | Status: DC | PRN
  Administered 2015-09-19: 4 mg via INTRAVENOUS

## 2015-09-19 MED ORDER — DEXAMETHASONE SODIUM PHOSPHATE 10 MG/ML IJ SOLN
INTRAMUSCULAR | Status: AC
  Filled 2015-09-19: qty 1

## 2015-09-19 MED ORDER — IBUPROFEN 200 MG PO TABS: 200.0000 mg | ORAL_TABLET | Freq: Four times a day (QID) | ORAL | Status: DC | PRN

## 2015-09-19 MED ORDER — LIDOCAINE HCL (CARDIAC) 20 MG/ML IV SOLN
INTRAVENOUS | Status: AC
  Filled 2015-09-19: qty 5

## 2015-09-19 MED ORDER — LACTATED RINGERS IV SOLN: INTRAVENOUS | Status: DC

## 2015-09-19 MED ORDER — PROPOFOL 10 MG/ML IV BOLUS
INTRAVENOUS | Status: DC | PRN
  Administered 2015-09-19: 200 mg via INTRAVENOUS

## 2015-09-19 MED ORDER — INDOMETHACIN 50 MG RE SUPP
100.0000 mg | Freq: Once | RECTAL | Status: DC
  Filled 2015-09-19: qty 2

## 2015-09-19 MED ORDER — ARTIFICIAL TEARS OP OINT
TOPICAL_OINTMENT | OPHTHALMIC | Status: AC
  Filled 2015-09-19: qty 3.5

## 2015-09-19 MED ORDER — LIDOCAINE HCL (CARDIAC) 20 MG/ML IV SOLN
INTRAVENOUS | Status: DC | PRN
  Administered 2015-09-19: 100 mg via INTRAVENOUS

## 2015-09-19 MED ORDER — PROPOFOL 10 MG/ML IV BOLUS
INTRAVENOUS | Status: AC
  Filled 2015-09-19: qty 20

## 2015-09-19 MED ORDER — SUGAMMADEX SODIUM 200 MG/2ML IV SOLN
INTRAVENOUS | Status: AC
  Filled 2015-09-19: qty 2

## 2015-09-19 MED ORDER — FENTANYL CITRATE (PF) 100 MCG/2ML IJ SOLN
INTRAMUSCULAR | Status: DC | PRN
  Administered 2015-09-19 (×2): 50 ug via INTRAVENOUS

## 2015-09-19 MED ORDER — SODIUM CHLORIDE 0.9 % IV SOLN
INTRAVENOUS | Status: DC
  Administered 2015-09-19: 100 mL/h via INTRAVENOUS
  Administered 2015-09-21 – 2015-09-22 (×3): via INTRAVENOUS
  Administered 2015-09-22: 100 mL/h via INTRAVENOUS
  Administered 2015-09-23: 08:00:00 via INTRAVENOUS

## 2015-09-19 MED ORDER — CIPROFLOXACIN IN D5W 400 MG/200ML IV SOLN
INTRAVENOUS | Status: AC
  Filled 2015-09-19: qty 200

## 2015-09-19 MED ORDER — DEXAMETHASONE SODIUM PHOSPHATE 10 MG/ML IJ SOLN
INTRAMUSCULAR | Status: DC | PRN
  Administered 2015-09-19: 10 mg via INTRAVENOUS

## 2015-09-19 MED ORDER — INDOMETHACIN 50 MG RE SUPP
RECTAL | Status: DC | PRN
  Administered 2015-09-19: 100 mg via RECTAL

## 2015-09-19 MED ORDER — ONDANSETRON HCL 4 MG/2ML IJ SOLN: 4.0000 mg | Freq: Once | INTRAMUSCULAR | Status: DC | PRN

## 2015-09-19 MED ORDER — MIDAZOLAM HCL 2 MG/2ML IJ SOLN
INTRAMUSCULAR | Status: AC
  Filled 2015-09-19: qty 2

## 2015-09-19 MED ORDER — IBUPROFEN 100 MG/5ML PO SUSP
200.0000 mg | Freq: Four times a day (QID) | ORAL | Status: DC | PRN
  Filled 2015-09-19: qty 20

## 2015-09-19 MED ORDER — HYDROCODONE-ACETAMINOPHEN 7.5-325 MG PO TABS: 1.0000 | ORAL_TABLET | Freq: Once | ORAL | Status: DC | PRN

## 2015-09-19 MED ORDER — SUCCINYLCHOLINE CHLORIDE 20 MG/ML IJ SOLN
INTRAMUSCULAR | Status: DC | PRN
  Administered 2015-09-19: 100 mg via INTRAVENOUS

## 2015-09-19 MED ORDER — MEPERIDINE HCL 50 MG/ML IJ SOLN: 6.2500 mg | INTRAMUSCULAR | Status: DC | PRN

## 2015-09-19 MED ORDER — KETOROLAC TROMETHAMINE 30 MG/ML IJ SOLN: 30.0000 mg | Freq: Once | INTRAMUSCULAR | Status: DC

## 2015-09-19 NOTE — Progress Notes (Signed)
Progress Note   Gary Roach [id number] DOB: September 05, 1940 DOA: 2015/09/25 PCP: Gary Stack, MD   Brief Narrative:   Gary Roach is an 75 y.o. male with a PMH of hypertension and diabetes, prior nephrolithiasis who was admitted 09-25-2015 with a chief complaint of the sudden onset of right flank pain accompanied by nausea and vomiting.  Assessment/Plan:   Principal Problem:  Choledocholithiasis with cholangitis with obstruction - Continue empiric Zosyn (switch to PO 09/20/15 if he remains stable). - S/P ERCP with sphincterotomy & CBD stone removal. - Monitor LFTs. - Provide pain control.    Duodenitis - PPI x 2 months.  No NSAIDS/ASA. - F/U with Gary Roach.  Active Problems:  Hypertension - Continue lisinopril.   Nephrolithiasis - Continue Avodart.   Hyperlipidemia associated with type 2 diabetes mellitus (HCC) - Continue Crestor.   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 Creatinine improved with hydration. - Hold Actos for now. SSI, insulin sensitive scale every 4 hours while nothing by mouth. - CBGs 77-117.   DVT prophylaxis - SCDs ordered. Hold Lovenox for now.   Family Communication/Anticipated D/C date and plan/Code Status   Family Communication: No family at the bedside. Disposition Plan: Home when LFTs improved and gallstone extracted. Anticipated D/C date:   09/20/15 if LFTs improved & cleared for D/C by GI. Code Status:  Full code.   IV Access:    Peripheral IV   Procedures and diagnostic studies:   Ct Renal Stone Study  2015-09-25  CLINICAL DATA:  Right flank pain, nausea, and vomiting with sudden onset last night. History of kidney stones. EXAM: CT ABDOMEN AND PELVIS WITHOUT CONTRAST TECHNIQUE: Multidetector CT imaging of the abdomen and pelvis was performed following the standard protocol without IV contrast. COMPARISON:  06/28/2009 FINDINGS:  Atelectasis in the lung bases. 15 mm stone in the lower pole right kidney. No hydronephrosis or hydroureter on either side. No ureteral stones or bladder stones identified. Bladder wall is not thickened. There is a stone in the distal common bile duct with prominent dilatation of the intra and extrahepatic bile ducts. Several gallstones are also present. No obvious gallbladder wall thickening. Fatty infiltration of the pancreas. The spleen, adrenal glands, abdominal aorta, inferior vena cava, and retroperitoneal lymph nodes are unremarkable. Small esophageal hiatal hernia. Stomach, small bowel, and colon are not abnormally distended. Scattered diverticula in the colon. No free air or free fluid in the abdomen. Abdominal wall musculature appears intact. Prominent visceral adipose tissues. Pelvis: Fat in the inguinal canals bilaterally. The appendix is not identified. Scattered diverticula in the sigmoid colon without evidence of diverticulitis. Prostate gland is enlarged, measuring 5.1 cm diameter. Prostatic impression upon the bladder base. Bladder wall is not thickened. The no free or loculated pelvic fluid collections. Degenerative changes in the spine. No destructive bone lesions. IMPRESSION: Nonobstructing stone in the lower pole right kidney. There is an obstructing stone in the distal common bile duct with proximal intra and extrahepatic bile duct dilatation. Cholelithiasis. Enlarged prostate gland. Electronically Signed   By: Lucienne Capers M.D.   On: 2015-09-25 02:02   US Abdomen Limited Ruq  09/25/15  CLINICAL DATA:  Obstructing stone at the distal common bile duct on CT. Further evaluation requested. Initial encounter. EXAM: US ABDOMEN LIMITED - RIGHT UPPER QUADRANT COMPARISON:  CT of the abdomen and pelvis performed earlier today at 1:51 a.m. FINDINGS: Gallbladder: Stones are again noted within the gallbladder, measuring up  to 2.2 cm in size. No definite gallbladder wall thickening or  pericholecystic fluid is seen. No ultrasonographic Murphy's sign is elicited. Common bile duct: Diameter: 1.1 cm; dilated, as noted on CT, reflecting the distal obstructing stone. The known obstructing stone is not seen on this study due to overlying bowel gas. Liver: No focal lesion identified. Intrahepatic biliary duct dilatation is better characterized on recent CT. Within normal limits in parenchymal echogenicity. IMPRESSION: 1. Dilatation of the common bile duct and intrahepatic biliary ductal dilatation, reflecting the obstructing stone at the distal common bile duct as seen on CT. The known obstructing stone is not characterized on this study due to overlying bowel gas. 2. Underlying cholelithiasis.  No evidence for cholecystitis. Electronically Signed   By: Garald Balding M.D.   On: 09/18/2015 06:36     Medical Consultants:    Gastroenterology  Anti-Infectives:   Zosyn 09/18/15--->   Subjective:    Gary Roach feels better today.  Was quite anxious prior to procedure, but much calmer now.  A bit groggy post procedure.  No N/V.  Objective:    Filed Vitals:   09/18/15 1806 09/18/15 2111 09/19/15 0600 09/19/15 0709  BP: 124/64 117/69 112/69 124/76  Pulse: 58 58 65 63  Temp: 98.4 F (36.9 C) 98 F (36.7 C) 97.6 F (36.4 C) 98.3 F (36.8 C)  TempSrc: Oral Oral Oral Oral  Resp: 18 16 16 16   Height:      Weight:      SpO2: 98% 97% 99% 98%    Intake/Output Summary (Last 24 hours) at 09/19/15 [id number] Last data filed at 09/19/15 0600  Gross per 24 hour  Intake 1097.5 ml  Output    925 ml  Net  172.5 ml   Filed Weights   09/18/15 0855  Weight: 105.325 kg (232 lb 3.2 oz)    Exam: Gen:  NAD Cardiovascular:  RRR, No M/R/G Respiratory:  Lungs CTAB Gastrointestinal:  Abdomen soft, NT/ND, + BS Extremities:  No C/E/C   Data Reviewed:    Labs: Basic Metabolic Panel:  Recent Labs Lab 09/18/15 0342 09/19/15 0510  NA 139 138  K 4.4 3.9  CL 105 107  CO2 22 23   GLUCOSE 142* 117*  BUN 33* 21*  CREATININE 1.33* 1.08  CALCIUM 9.1 8.6*   GFR Estimated Creatinine Clearance: 75.3 mL/min (by C-G formula based on Cr of 1.08). Liver Function Tests:  Recent Labs Lab 09/18/15 0342 09/19/15 0510  AST 116* 116*  ALT 186* 154*  ALKPHOS 617* 533*  BILITOT 5.5* 6.2*  PROT 6.2* 5.7*  ALBUMIN 3.0* 2.6*   CBC:  Recent Labs Lab 09/18/15 0342 09/19/15 0510  WBC 17.4* 6.7  NEUTROABS 15.3*  --   HGB 13.3 12.6*  HCT 40.4 38.3*  MCV 94.0 94.3  PLT 272 263   CBG:  Recent Labs Lab 09/18/15 1628 09/18/15 2007 09/19/15 0026 09/19/15 0356 09/19/15 0728  GLUCAP 94 77 101* 100* 117*   Microbiology No results found for this or any previous visit (from the past 240 hour(s)).   Medications:   . antiseptic oral rinse  7 mL Mouth Rinse q12n4p  . chlorhexidine  15 mL Mouth Rinse BID  . dutasteride  0.5 mg Oral Daily  . insulin aspart  0-9 Units Subcutaneous 6 times per day  . lisinopril  40 mg Oral Daily  . piperacillin-tazobactam (ZOSYN)  IV  3.375 g Intravenous 3 times per day  . rosuvastatin  10 mg Oral q1800  Continuous Infusions: . dextrose 5 % and 0.9% NaCl 75 mL/hr at 09/18/15 2058    Time spent: 25 minutes.   LOS: 1 day   Prudy Candy  [facility] Pager [phone]. If unable to reach me by pager, please call my cell phone at [phone].  *Please refer to [email or web address], password [email or web address] to get updated schedule on who will round on this patient, as hospitalists switch teams weekly. If 7PM-7AM, please contact night-coverage at [email or web address], password [email or web address] for any overnight needs.  09/19/2015, 8:22 AM

## 2015-09-19 NOTE — Anesthesia Postprocedure Evaluation (Signed)
Anesthesia Post Note  Patient: Gary Roach  Procedure(s) Performed: Procedure(s) (LRB): ENDOSCOPIC RETROGRADE CHOLANGIOPANCREATOGRAPHY (ERCP) (N/A)  Patient location during evaluation: PACU Anesthesia Type: General Level of consciousness: awake Pain management: pain level controlled Vital Signs Assessment: post-procedure vital signs reviewed and stable Respiratory status: spontaneous breathing Cardiovascular status: stable Postop Assessment: no signs of nausea or vomiting Anesthetic complications: no    Last Vitals:  Filed Vitals:   09/19/15 0900 09/19/15 0902  BP: 112/69 112/69  Pulse:  70  Temp:    Resp:  25    Last Pain:  Filed Vitals:   09/19/15 0912  PainSc: 0-No pain                 Lacheryl Niesen JR,JOHN Mateo Flow

## 2015-09-19 NOTE — Progress Notes (Signed)
Utilization review completed.  

## 2015-09-19 NOTE — Op Note (Signed)
[facility] [place], [address]   ENDOSOCOPY REPORT - 09/19/2015  name:  Gary Roach, Gary Roach      mr #:  [id number] dob:  06/05/1941     mr #:  male g.i. attending:  Ladene Artist, MD, Dinah Beers. fellow:           assistant:  Romilda Garret   procedure:     1.  ERCP with sphincterotomy/papillotomy  2.  ERCP with removal of calculus/calculi indications:     abdominal pain of suspected biliary origin, abnormal abdominal CT, established bile duct stone(s), and abnormal liver function test. medications:     Per Anesthesia topical anesthetic:     none description of procedure: The risks benefits and alternatives of the procedure were thoroughly explained.  The patient was informed of possible complications, which included but was not limited to pancreatitis, bleeding, perforation, infection, drug reaction, possible surgery for any complications and remote chance of life threatening complications. Patient's questions were answered and informed consent obtained. The duodenoscope [id number] endoscope was introduced through the mouth and advanced to the second portion of the duodenum.    findings:  There was mild dilation of the CBD and cholangitis with pus noted in bile.  A single 10 mm stone was seen in the distal common bile duct. The ampulla appeared normal. It was freely cannulated and a guidewire advanced.  With guidewire in the bile duct, a biliary sphincterotomy was performed using the sphincterotome.   Using a stone extraction balloon the bile duct was swept three times.  The CBD stone was removed from the bile duct successfully. Multiple duodenal erosions noted. The PD was not cannulated or injected by intention. Excellent biliary drainage noted. The scope was then completely withdrawn from the patient and the procedure terminated. specimen(s) taken:  [ ]  Yes     [x]  No estimated blood loss:  [x]  None     [ ]  < 5cc      [ ]  >  5cc  complications:  There were no complications. patient condition:  stable  impression:     1.  Cholangits 2.  Single CBD stone removed from the bile duct with balloon extraction following sphincterotomy 3.  Erosive duodenitis  recommendations:     1.  Trend liver enzymes 2.  Continue antibiotics for 7 days, change to PO antibioitic tomorrow if he remains stable 3.  PPI daily for 2 months and avoid ASA/NSAIDs for duodenitis 4.  GI follow up with Dr. Mercer Pod, MD, [facility]  cc:     [facility]

## 2015-09-19 NOTE — Transfer of Care (Signed)
Immediate Anesthesia Transfer of Care Note  Patient: Gary Roach  Procedure(s) Performed: Procedure(s): ENDOSCOPIC RETROGRADE CHOLANGIOPANCREATOGRAPHY (ERCP) (N/A)  Patient Location: PACU  Anesthesia Type:General  Level of Consciousness:  sedated, patient cooperative and responds to stimulation  Airway & Oxygen Therapy:Patient Spontanous Breathing and Patient connected to face mask oxgen  Post-op Assessment:  Report given to PACU RN and Post -op Vital signs reviewed and stable  Post vital signs:  Reviewed and stable  Last Vitals:  Filed Vitals:   09/19/15 0600 09/19/15 0709  BP: 112/69 124/76  Pulse: 65 63  Temp: 36.4 C 36.8 C  Resp: 16 16    Complications: No apparent anesthesia complications

## 2015-09-19 NOTE — Anesthesia Procedure Notes (Signed)
Procedure Name: Intubation Date/Time: 09/19/2015 8:19 AM Performed by: Maxwell Caul Pre-anesthesia Checklist: Patient identified, Emergency Drugs available, Suction available and Patient being monitored Patient Re-evaluated:Patient Re-evaluated prior to inductionOxygen Delivery Method: Circle System Utilized Preoxygenation: Pre-oxygenation with 100% oxygen Intubation Type: IV induction Ventilation: Mask ventilation without difficulty Laryngoscope Size: Mac and 4 Grade View: Grade I Tube type: Oral Tube size: 7.5 mm Number of attempts: 1 Airway Equipment and Method: Stylet Placement Confirmation: ETT inserted through vocal cords under direct vision,  positive ETCO2 and breath sounds checked- equal and bilateral Secured at: 21 cm Tube secured with: Tape Dental Injury: Teeth and Oropharynx as per pre-operative assessment

## 2015-09-19 NOTE — Interval H&P Note (Signed)
History and Physical Interval Note:  09/19/2015 8:11 AM  Gary Roach  has presented today for surgery, with the diagnosis of biliary stone removal  The various methods of treatment have been discussed with the patient and family. After consideration of risks, benefits and other options for treatment, the patient has consented to  Procedure(s): ENDOSCOPIC RETROGRADE CHOLANGIOPANCREATOGRAPHY (ERCP) (N/A) as a surgical intervention .  The patient's history has been reviewed, patient examined, no change in status, stable for surgery.  I have reviewed the patient's chart and labs.  Questions were answered to the patient's satisfaction.     Pricilla Riffle. [place] Plan

## 2015-09-19 NOTE — H&P (View-Only) (Signed)
Referring Provider:  Dr. Altha Harm Rama Primary Care Physician:  Sheela Stack, MD Primary Gastroenterologist:  Dr. Earlean Shawl  Reason for Consultation:  Symptomatic choledocholithiasis  HPI: Gary Roach is a 75 y.o. male admitted through the emergency room this morning and found to have choledocholithiasis.  The patient has no family history of biliary tract disease. He had abdominal pain and chills approximately 3 days prior to admission, which resolved spontaneously. No history of dark urine.  The night before admission, he had the onset of right flank pain that then became bright anterior abdominal pain, as well as some mild chills but no fevers. He presented to the emergency room, where he was found to have significant elevation of liver chemistries, with bilirubin of 5.5 and transaminases in the 100-200 range, alkaline phosphatase over 600. White count was elevated at 17,000, 400, but he was afebrile.  A CT of the abdomen showed a distal common duct stone as well as both intrahepatic and extrahepatic biliary ductal dilatation.  A subsequent ultrasound has shown that he also has cholelithiasis.  At the present time, the patient is pain-free.  The patient indicates that he had a colonoscopy, apparently negative, by Dr. Earlie Raveling about 10 years ago and recently received a letter recommending a follow-up examination, which he has declined to pursue.    Past Medical History  Diagnosis Date  . Hypertension   . Diabetes mellitus   . Renal disorder   . Nephrolithiasis   . Hyperlipidemia associated with type 2 diabetes mellitus [facility])     Past Surgical History  Procedure Laterality Date  . Knee arthroscopy Left   . Lumbar fusion      L4-L5    Prior to Admission medications   Medication Sig Start Date End Date Taking? Authorizing Provider  ALPRAZolam Duanne Moron) 0.5 MG tablet Take 0.5 mg by mouth 2 (two) times daily as needed for anxiety.    Yes Historical Provider, MD   aspirin EC 81 MG tablet Take 81 mg by mouth daily.   Yes Historical Provider, MD  dutasteride (AVODART) 0.5 MG capsule Take 0.5 mg by mouth daily.   Yes Historical Provider, MD  ergocalciferol (VITAMIN D2) 50000 units capsule Take 50,000 Units by mouth once a week.   Yes Historical Provider, MD  lisinopril (PRINIVIL,ZESTRIL) 40 MG tablet Take 40 mg by mouth daily.   Yes Historical Provider, MD  pioglitazone (ACTOS) 15 MG tablet Take 15 mg by mouth daily.   Yes Historical Provider, MD  rosuvastatin (CRESTOR) 10 MG tablet Take 10 mg by mouth daily.   Yes Historical Provider, MD  traMADol (ULTRAM) 50 MG tablet Take 50 mg by mouth every 12 (twelve) hours as needed for moderate pain.   Yes Historical Provider, MD  triamcinolone cream (KENALOG) 0.1 % Apply 1 application topically 2 (two) times daily as needed (itching).    Yes Historical Provider, MD  predniSONE (DELTASONE) 5 MG tablet Take 5-30 mg by mouth as directed. Reported on 09/18/2015    Historical Provider, MD    Current Facility-Administered Medications  Medication Dose Route Frequency Provider Last Rate Last Dose  . acetaminophen (TYLENOL) tablet 650 mg  650 mg Oral Q6H PRN Venetia Maxon Rama, MD       Or  . acetaminophen (TYLENOL) suppository 650 mg  650 mg Rectal Q6H PRN Venetia Maxon Rama, MD      . ALPRAZolam Duanne Moron) tablet 0.5 mg  0.5 mg Oral TID PRN Venetia Maxon Rama, MD   0.5 mg at  09/18/15 2103  . alum & mag hydroxide-simeth (MAALOX/MYLANTA) 200-200-20 MG/5ML suspension 30 mL  30 mL Oral Q6H PRN Christina P Rama, MD      . antiseptic oral rinse (CPC / CETYLPYRIDINIUM CHLORIDE 0.05%) solution 7 mL  7 mL Mouth Rinse q12n4p Venetia Maxon Rama, MD   7 mL at 09/18/15 1701  . chlorhexidine (PERIDEX) 0.12 % solution 15 mL  15 mL Mouth Rinse BID Venetia Maxon Rama, MD   15 mL at 09/18/15 2102  . dextrose 5 %-0.9 % sodium chloride infusion   Intravenous Continuous Fransico Meadow, PA-C 75 mL/hr at 09/18/15 2058    . dutasteride (AVODART) capsule 0.5  mg  0.5 mg Oral Daily Venetia Maxon Rama, MD   0.5 mg at 09/18/15 1116  . insulin aspart (novoLOG) injection 0-9 Units  0-9 Units Subcutaneous 6 times per day Venetia Maxon Rama, MD   1 Units at 09/18/15 1205  . lisinopril (PRINIVIL,ZESTRIL) tablet 40 mg  40 mg Oral Daily Venetia Maxon Rama, MD   40 mg at 09/18/15 1116  . morphine 4 MG/ML injection 4 mg  4 mg Intravenous Q3H PRN Christina P Rama, MD      . ondansetron (ZOFRAN) tablet 4 mg  4 mg Oral Q6H PRN Venetia Maxon Rama, MD       Or  . ondansetron (ZOFRAN) injection 4 mg  4 mg Intravenous Q6H PRN Christina P Rama, MD      . oxyCODONE (Oxy IR/ROXICODONE) immediate release tablet 5 mg  5 mg Oral Q4H PRN Christina P Rama, MD      . piperacillin-tazobactam (ZOSYN) IVPB 3.375 g  3.375 g Intravenous 3 times per day Berton Mount, RPH   3.375 g at 09/18/15 2100  . rosuvastatin (CRESTOR) tablet 10 mg  10 mg Oral q1800 Venetia Maxon Rama, MD   10 mg at 09/18/15 1701    Allergies as of 09/18/2015 - Review Complete 09/18/2015  Allergen Reaction Noted  . Chocolate  09/18/2015    Family History  Problem Relation Age of Onset  . Dementia Mother     Social History   Social History  . Marital Status: Single    Spouse Name: N/A  . Number of Children: N/A  . Years of Education: N/A   Occupational History  . Lawyer    Social History Main Topics  . Smoking status: Never Smoker   . Smokeless tobacco: Not on file  . Alcohol Use: 0.0 oz/week    0 Standard drinks or equivalent per week     Comment: Occasionally/Social  . Drug Use: No  . Sexual Activity: Not on file   Other Topics Concern  . Not on file   Social History Narrative   Divorced. Lives alone.  Independent of ADLs. Ambulates with a cane.    Review of Systems:  negative for chest pain, exertional dyspnea, or urinary symptoms   Physical Exam: Vital signs in last 24 hours: Temp:  [97.7 F (36.5 C)-98.7 F (37.1 C)] 98 F (36.7 C) (01/28 2111) Pulse Rate:  [58-86] 58 (01/28  2111) Resp:  [16-20] 16 (01/28 2111) BP: (91-125)/(57-69) 117/69 mmHg (01/28 2111) SpO2:  [93 %-99 %] 97 % (01/28 2111) Weight:  [105.325 kg (232 lb 3.2 oz)] 105.325 kg (232 lb 3.2 oz) (01/28 0855) Last BM Date: 09/18/15 General:   Alert,  Well-developed, well-nourished, pleasant and cooperative in NAD Head:  Normocephalic and atraumatic. Eyes:  Sclera clear, no icterus.   Conjunctiva pink. Mouth:  No ulcerations or lesions.  Oropharynx pink & moist. Neck:   No masses or thyromegaly. Lungs:  Clear throughout to auscultation.   No wheezes, crackles, or rhonchi. No evident respiratory distress. Heart:   Regular rate and rhythm; no murmurs, clicks, rubs,  or gallops. Abdomen:  Soft, nontender, nontympanitic, and nondistended. No masses, hepatosplenomegaly or ventral hernias noted. Quiet bowel sounds, without bruits, guarding, or rebound.   Rectal:  Not performed    Msk:   Symmetrical without gross deformities. Pulses:  Normal radial pulse noted. Extremities:   Without clubbing, cyanosis, or edema. Neurologic:  Alert and coherent;  grossly normal neurologically. Skin:  Intact without significant lesions or rashes. Cervical Nodes:  No significant cervical adenopathy. Psych:   Alert and cooperative. Normal mood and affect.  Intake/Output from previous day:   Intake/Output this shift:    Lab Results:  Recent Labs  09/18/15 0342  WBC 17.4*  HGB 13.3  HCT 40.4  PLT 272   BMET  Recent Labs  09/18/15 0342  NA 139  K 4.4  CL 105  CO2 22  GLUCOSE 142*  BUN 33*  CREATININE 1.33*  CALCIUM 9.1   LFT  Recent Labs  09/18/15 0342  PROT 6.2*  ALBUMIN 3.0*  AST 116*  ALT 186*  ALKPHOS 617*  BILITOT 5.5*   PT/INR No results for input(s): LABPROT, INR in the last 72 hours.  Studies/Results: Ct Renal Stone Study  09/18/2015  CLINICAL DATA:  Right flank pain, nausea, and vomiting with sudden onset last night. History of kidney stones. EXAM: CT ABDOMEN AND PELVIS WITHOUT  CONTRAST TECHNIQUE: Multidetector CT imaging of the abdomen and pelvis was performed following the standard protocol without IV contrast. COMPARISON:  06/28/2009 FINDINGS: Atelectasis in the lung bases. 15 mm stone in the lower pole right kidney. No hydronephrosis or hydroureter on either side. No ureteral stones or bladder stones identified. Bladder wall is not thickened. There is a stone in the distal common bile duct with prominent dilatation of the intra and extrahepatic bile ducts. Several gallstones are also present. No obvious gallbladder wall thickening. Fatty infiltration of the pancreas. The spleen, adrenal glands, abdominal aorta, inferior vena cava, and retroperitoneal lymph nodes are unremarkable. Small esophageal hiatal hernia. Stomach, small bowel, and colon are not abnormally distended. Scattered diverticula in the colon. No free air or free fluid in the abdomen. Abdominal wall musculature appears intact. Prominent visceral adipose tissues. Pelvis: Fat in the inguinal canals bilaterally. The appendix is not identified. Scattered diverticula in the sigmoid colon without evidence of diverticulitis. Prostate gland is enlarged, measuring 5.1 cm diameter. Prostatic impression upon the bladder base. Bladder wall is not thickened. The no free or loculated pelvic fluid collections. Degenerative changes in the spine. No destructive bone lesions. IMPRESSION: Nonobstructing stone in the lower pole right kidney. There is an obstructing stone in the distal common bile duct with proximal intra and extrahepatic bile duct dilatation. Cholelithiasis. Enlarged prostate gland. Electronically Signed   By: Lucienne Capers M.D.   On: 09/18/2015 02:02   US Abdomen Limited Ruq  09/18/2015  CLINICAL DATA:  Obstructing stone at the distal common bile duct on CT. Further evaluation requested. Initial encounter. EXAM: US ABDOMEN LIMITED - RIGHT UPPER QUADRANT COMPARISON:  CT of the abdomen and pelvis performed earlier today  at 1:51 a.m. FINDINGS: Gallbladder: Stones are again noted within the gallbladder, measuring up to 2.2 cm in size. No definite gallbladder wall thickening or pericholecystic fluid is seen. No ultrasonographic Murphy's sign  is elicited. Common bile duct: Diameter: 1.1 cm; dilated, as noted on CT, reflecting the distal obstructing stone. The known obstructing stone is not seen on this study due to overlying bowel gas. Liver: No focal lesion identified. Intrahepatic biliary duct dilatation is better characterized on recent CT. Within normal limits in parenchymal echogenicity. IMPRESSION: 1. Dilatation of the common bile duct and intrahepatic biliary ductal dilatation, reflecting the obstructing stone at the distal common bile duct as seen on CT. The known obstructing stone is not characterized on this study due to overlying bowel gas. 2. Underlying cholelithiasis.  No evidence for cholecystitis. Electronically Signed   By: Garald Balding M.D.   On: 09/18/2015 06:36    Impression: 1. Symptomatic choledocholithiasis with possible early cholangitis but no evidence of frank septic cholangitis. 2. Cholelithiasis 3. Overdue for colon cancer screening   Plan: 1. ERCP with sphincterotomy and stone extraction tomorrow. No need for urgent ERCP, and in fact, it might actually be safer after he has had some antibiotic therapy, in view of his elevated white count. Moreover, since he has already received Lovenox today, it would be preferable to let that get out of his system before doing a sphincterotomy.   I have very carefully and thoroughly explained the nature, purpose, risks, and alternatives of ERCP and sphincterotomy and stone extraction with the patient, with the help of an illustration, and he has a good understanding and no further questions. Risks discussed included roughly a 3% risk of pancreatitis which can range from mild to fatal in severity, anesthesia problems, perforation, bleeding, and infection.  2.  I have encouraged the patient to reconsider having a screening colonoscopy, and explained the rationale for doing so.  3. The patient will need his gallbladder out. He prefers to go home after his ERCP, and have arrangements for his cholecystectomy as an outpatient.  4. Time spent at the bedside, including care coordination, was approximately 50 minutes, more than 50% counseling the patient regarding the nature of his condition and the options and risks regarding its management.   1 LOS: 0 days   Homero Hyson,Mohanad V  09/18/2015, 10:06 PM   Pager [phone] If no answer or after 5 PM call [phone]

## 2015-09-20 ENCOUNTER — Inpatient Hospital Stay (HOSPITAL_COMMUNITY): Payer: Medicare Other | Admitting: Anesthesiology

## 2015-09-20 ENCOUNTER — Inpatient Hospital Stay (HOSPITAL_COMMUNITY): Payer: Medicare Other

## 2015-09-20 ENCOUNTER — Encounter (HOSPITAL_COMMUNITY)
Admission: EM | Disposition: A | Discharge status: Home / Self Care | Payer: Self-pay | Source: Home / Self Care | Attending: Internal Medicine

## 2015-09-20 DIAGNOSIS — K8050 Calculus of bile duct without cholangitis or cholecystitis without obstruction: Secondary | ICD-10-CM | POA: Diagnosis present

## 2015-09-20 DIAGNOSIS — K830 Cholangitis: Secondary | ICD-10-CM

## 2015-09-20 HISTORY — PX: LAPAROSCOPIC LYSIS OF ADHESIONS: SHX5905

## 2015-09-20 HISTORY — PX: CHOLECYSTECTOMY: SHX55

## 2015-09-20 LAB — COMPREHENSIVE METABOLIC PANEL
ALBUMIN: 2.3 g/dL — AB (ref 3.5–5.0)
ALK PHOS: 490 U/L — AB (ref 38–126)
ALT: 131 U/L — ABNORMAL HIGH (ref 17–63)
ANION GAP: 9 (ref 5–15)
AST: 86 U/L — ABNORMAL HIGH (ref 15–41)
BILIRUBIN TOTAL: 3.7 mg/dL — AB (ref 0.3–1.2)
BUN: 29 mg/dL — ABNORMAL HIGH (ref 6–20)
CALCIUM: 8.5 mg/dL — AB (ref 8.9–10.3)
CO2: 22 mmol/L (ref 22–32)
Chloride: 107 mmol/L (ref 101–111)
Creatinine, Ser: 1.42 mg/dL — ABNORMAL HIGH (ref 0.61–1.24)
GFR, EST AFRICAN AMERICAN: 55 mL/min — AB (ref >60)
GFR, EST NON AFRICAN AMERICAN: 47 mL/min — AB (ref >60)
GLUCOSE: 152 mg/dL — AB (ref 65–99)
Potassium: 4.4 mmol/L (ref 3.5–5.1)
Sodium: 138 mmol/L (ref 135–145)
TOTAL PROTEIN: 5.4 g/dL — AB (ref 6.5–8.1)

## 2015-09-20 LAB — SURGICAL PCR SCREEN
MRSA, PCR: NEGATIVE
Staphylococcus aureus: NEGATIVE

## 2015-09-20 LAB — GLUCOSE, CAPILLARY
GLUCOSE-CAPILLARY: 103 mg/dL — AB (ref 65–99)
GLUCOSE-CAPILLARY: 147 mg/dL — AB (ref 65–99)
GLUCOSE-CAPILLARY: 118 mg/dL — AB (ref 65–99)
GLUCOSE-CAPILLARY: 148 mg/dL — AB (ref 65–99)
Glucose-Capillary: 123 mg/dL — ABNORMAL HIGH (ref 65–99)
Glucose-Capillary: 159 mg/dL — ABNORMAL HIGH (ref 65–99)

## 2015-09-20 SURGERY — LAPAROSCOPIC CHOLECYSTECTOMY WITH INTRAOPERATIVE CHOLANGIOGRAM
Anesthesia: General

## 2015-09-20 MED ORDER — IOHEXOL 300 MG/ML  SOLN
INTRAMUSCULAR | Status: DC | PRN
  Administered 2015-09-20: 50 mL via INTRAVENOUS

## 2015-09-20 MED ORDER — POVIDONE-IODINE 10 % EX OINT
TOPICAL_OINTMENT | CUTANEOUS | Status: DC | PRN
  Administered 2015-09-20: 1 via TOPICAL

## 2015-09-20 MED ORDER — PROPOFOL 10 MG/ML IV BOLUS
INTRAVENOUS | Status: DC | PRN
  Administered 2015-09-20: 200 mg via INTRAVENOUS

## 2015-09-20 MED ORDER — INSULIN ASPART 100 UNIT/ML ~~LOC~~ SOLN: 0.0000 [IU] | Freq: Every day | SUBCUTANEOUS | Status: DC

## 2015-09-20 MED ORDER — PREDNISONE 5 MG PO TABS: 5.0000 mg | ORAL_TABLET | ORAL | Status: DC

## 2015-09-20 MED ORDER — ALPRAZOLAM 0.5 MG PO TABS
0.5000 mg | ORAL_TABLET | Freq: Two times a day (BID) | ORAL | Status: DC | PRN
  Administered 2015-09-22 (×2): 0.5 mg via ORAL
  Filled 2015-09-20 (×2): qty 1

## 2015-09-20 MED ORDER — MIDAZOLAM HCL 5 MG/5ML IJ SOLN
INTRAMUSCULAR | Status: DC | PRN
  Administered 2015-09-20: 2 mg via INTRAVENOUS

## 2015-09-20 MED ORDER — ONDANSETRON HCL 4 MG/2ML IJ SOLN: 4.0000 mg | Freq: Once | INTRAMUSCULAR | Status: DC | PRN

## 2015-09-20 MED ORDER — GLYCOPYRROLATE 0.2 MG/ML IJ SOLN
INTRAMUSCULAR | Status: DC | PRN
  Administered 2015-09-20: 0.6 mg via INTRAVENOUS

## 2015-09-20 MED ORDER — DEXTROSE 5 % IV SOLN: 2.0000 g | INTRAVENOUS | Status: DC

## 2015-09-20 MED ORDER — LISINOPRIL 40 MG PO TABS
40.0000 mg | ORAL_TABLET | Freq: Every day | ORAL | Status: DC
Start: 2015-09-21 — End: 2015-09-24
  Administered 2015-09-21 – 2015-09-24 (×4): 40 mg via ORAL
  Filled 2015-09-20 (×4): qty 1

## 2015-09-20 MED ORDER — LACTATED RINGERS IV SOLN
INTRAVENOUS | Status: DC
  Administered 2015-09-20: 1000 mL via INTRAVENOUS

## 2015-09-20 MED ORDER — ONDANSETRON HCL 4 MG/2ML IJ SOLN
INTRAMUSCULAR | Status: DC | PRN
  Administered 2015-09-20: 4 mg via INTRAVENOUS

## 2015-09-20 MED ORDER — AMOXICILLIN-POT CLAVULANATE 875-125 MG PO TABS
1.0000 | ORAL_TABLET | Freq: Two times a day (BID) | ORAL | Status: DC
Start: 2015-09-20 — End: 2015-09-24
  Administered 2015-09-20 – 2015-09-24 (×9): 1 via ORAL
  Filled 2015-09-20 (×10): qty 1

## 2015-09-20 MED ORDER — BUPIVACAINE-EPINEPHRINE 0.25% -1:200000 IJ SOLN
INTRAMUSCULAR | Status: DC | PRN
  Administered 2015-09-20: 18 mL

## 2015-09-20 MED ORDER — DEXTROSE 5 % IV SOLN
INTRAVENOUS | Status: AC
  Filled 2015-09-20: qty 2

## 2015-09-20 MED ORDER — ROCURONIUM BROMIDE 100 MG/10ML IV SOLN
INTRAVENOUS | Status: DC | PRN
  Administered 2015-09-20 (×3): 10 mg via INTRAVENOUS
  Administered 2015-09-20: 50 mg via INTRAVENOUS
  Administered 2015-09-20: 10 mg via INTRAVENOUS

## 2015-09-20 MED ORDER — FENTANYL CITRATE (PF) 250 MCG/5ML IJ SOLN
INTRAMUSCULAR | Status: AC
  Filled 2015-09-20: qty 5

## 2015-09-20 MED ORDER — MORPHINE SULFATE (PF) 2 MG/ML IV SOLN
1.0000 mg | INTRAVENOUS | Status: DC | PRN
  Administered 2015-09-20: 2 mg via INTRAVENOUS
  Filled 2015-09-20: qty 1

## 2015-09-20 MED ORDER — AMOXICILLIN-POT CLAVULANATE 875-125 MG PO TABS: 1.0000 | ORAL_TABLET | Freq: Two times a day (BID) | ORAL | Status: DC

## 2015-09-20 MED ORDER — PROPOFOL 10 MG/ML IV BOLUS
INTRAVENOUS | Status: AC
  Filled 2015-09-20: qty 20

## 2015-09-20 MED ORDER — EPHEDRINE SULFATE 50 MG/ML IJ SOLN
INTRAMUSCULAR | Status: DC | PRN
  Administered 2015-09-20: 5 mg via INTRAVENOUS
  Administered 2015-09-20: 10 mg via INTRAVENOUS

## 2015-09-20 MED ORDER — HYDROMORPHONE HCL 1 MG/ML IJ SOLN
0.2500 mg | INTRAMUSCULAR | Status: DC | PRN
  Administered 2015-09-20 (×4): 0.25 mg via INTRAVENOUS

## 2015-09-20 MED ORDER — MIDAZOLAM HCL 2 MG/2ML IJ SOLN
INTRAMUSCULAR | Status: AC
  Filled 2015-09-20: qty 2

## 2015-09-20 MED ORDER — POVIDONE-IODINE 10 % EX OINT
TOPICAL_OINTMENT | CUTANEOUS | Status: AC
  Filled 2015-09-20: qty 28.35

## 2015-09-20 MED ORDER — LACTATED RINGERS IR SOLN
Status: DC | PRN
  Administered 2015-09-20: 1000 mL

## 2015-09-20 MED ORDER — LIDOCAINE HCL (CARDIAC) 20 MG/ML IV SOLN
INTRAVENOUS | Status: DC | PRN
  Administered 2015-09-20: 50 mg via INTRAVENOUS

## 2015-09-20 MED ORDER — FENTANYL CITRATE (PF) 100 MCG/2ML IJ SOLN: 25.0000 ug | INTRAMUSCULAR | Status: DC | PRN

## 2015-09-20 MED ORDER — HYDROMORPHONE HCL 1 MG/ML IJ SOLN
INTRAMUSCULAR | Status: AC
  Filled 2015-09-20: qty 1

## 2015-09-20 MED ORDER — PANTOPRAZOLE SODIUM 40 MG PO TBEC: 40.0000 mg | DELAYED_RELEASE_TABLET | Freq: Two times a day (BID) | ORAL | Status: AC

## 2015-09-20 MED ORDER — 0.9 % SODIUM CHLORIDE (POUR BTL) OPTIME
TOPICAL | Status: DC | PRN
  Administered 2015-09-20: 1000 mL

## 2015-09-20 MED ORDER — DUTASTERIDE 0.5 MG PO CAPS
0.5000 mg | ORAL_CAPSULE | Freq: Every day | ORAL | Status: DC
  Filled 2015-09-20: qty 1

## 2015-09-20 MED ORDER — FENTANYL CITRATE (PF) 100 MCG/2ML IJ SOLN
INTRAMUSCULAR | Status: DC | PRN
  Administered 2015-09-20 (×5): 50 ug via INTRAVENOUS

## 2015-09-20 MED ORDER — HYDROMORPHONE HCL 1 MG/ML IJ SOLN
INTRAMUSCULAR | Status: DC | PRN
  Administered 2015-09-20: 1 mg via INTRAVENOUS
  Administered 2015-09-20 (×2): 0.5 mg via INTRAVENOUS

## 2015-09-20 MED ORDER — HYDROMORPHONE HCL 2 MG/ML IJ SOLN
INTRAMUSCULAR | Status: AC
  Filled 2015-09-20: qty 1

## 2015-09-20 MED ORDER — NEOSTIGMINE METHYLSULFATE 10 MG/10ML IV SOLN
INTRAVENOUS | Status: DC | PRN
  Administered 2015-09-20: 4 mg via INTRAVENOUS

## 2015-09-20 MED ORDER — INSULIN ASPART 100 UNIT/ML ~~LOC~~ SOLN: 0.0000 [IU] | Freq: Three times a day (TID) | SUBCUTANEOUS | Status: DC

## 2015-09-20 SURGICAL SUPPLY — 39 items
APPLIER CLIP 5 13 M/L LIGAMAX5 (MISCELLANEOUS) ×4
CATH REDDICK CHOLANGI 4FR 50CM (CATHETERS) ×4 IMPLANT
CHLORAPREP W/TINT 26ML (MISCELLANEOUS) ×4 IMPLANT
CLIP APPLIE 5 13 M/L LIGAMAX5 (MISCELLANEOUS) ×2 IMPLANT
CLIP TI LARGE 6 (CLIP) ×4 IMPLANT
CLIP TI MEDIUM 6 (CLIP) ×4 IMPLANT
COVER MAYO STAND STRL (DRAPES) ×4 IMPLANT
COVER SURGICAL LIGHT HANDLE (MISCELLANEOUS) ×4 IMPLANT
DECANTER SPIKE VIAL GLASS SM (MISCELLANEOUS) IMPLANT
DRAPE C-ARM 42X120 X-RAY (DRAPES) ×4 IMPLANT
DRAPE LAPAROSCOPIC ABDOMINAL (DRAPES) ×4 IMPLANT
ELECT PENCIL ROCKER SW 15FT (MISCELLANEOUS) ×4 IMPLANT
ELECT REM PT RETURN 9FT ADLT (ELECTROSURGICAL) ×4
ELECTRODE REM PT RTRN 9FT ADLT (ELECTROSURGICAL) ×2 IMPLANT
GAUZE SPONGE 4X4 12PLY STRL (GAUZE/BANDAGES/DRESSINGS) ×4 IMPLANT
GLOVE BIO SURGEON STRL SZ7.5 (GLOVE) ×4 IMPLANT
GOWN STRL REUS W/TWL XL LVL3 (GOWN DISPOSABLE) ×12 IMPLANT
HEMOSTAT SURGICEL 4X8 (HEMOSTASIS) ×4 IMPLANT
IV CATH 14GX2 1/4 (CATHETERS) ×4 IMPLANT
KIT BASIN OR (CUSTOM PROCEDURE TRAY) ×4 IMPLANT
LIQUID BAND (GAUZE/BANDAGES/DRESSINGS) ×4 IMPLANT
POUCH SPECIMEN RETRIEVAL 10MM (ENDOMECHANICALS) IMPLANT
SET IRRIG TUBING LAPAROSCOPIC (IRRIGATION / IRRIGATOR) ×4 IMPLANT
SLEEVE XCEL OPT CAN 5 100 (ENDOMECHANICALS) ×8 IMPLANT
SPONGE LAP 18X18 X RAY DECT (DISPOSABLE) ×4 IMPLANT
STAPLER VISISTAT 35W (STAPLE) ×4 IMPLANT
SUCTION POOLE TIP (SUCTIONS) ×4 IMPLANT
SUT MNCRL AB 4-0 PS2 18 (SUTURE) ×4 IMPLANT
SUT PDS AB 1 CTX 36 (SUTURE) ×16 IMPLANT
SUT SILK 2 0 (SUTURE) ×2
SUT SILK 2-0 18XBRD TIE 12 (SUTURE) ×2 IMPLANT
TAPE CLOTH SURG 4X10 WHT LF (GAUZE/BANDAGES/DRESSINGS) ×4 IMPLANT
TOWEL OR 17X26 10 PK STRL BLUE (TOWEL DISPOSABLE) ×4 IMPLANT
TOWEL OR NON WOVEN STRL DISP B (DISPOSABLE) ×4 IMPLANT
TRAY LAPAROSCOPIC (CUSTOM PROCEDURE TRAY) ×4 IMPLANT
TROCAR BLADELESS OPT 5 100 (ENDOMECHANICALS) ×4 IMPLANT
TROCAR BLADELESS OPT 5 75 (ENDOMECHANICALS) ×4 IMPLANT
TROCAR XCEL BLUNT TIP 100MML (ENDOMECHANICALS) ×4 IMPLANT
YANKAUER SUCT BULB TIP 10FT TU (MISCELLANEOUS) ×4 IMPLANT

## 2015-09-20 NOTE — Progress Notes (Signed)
[facility] Progress Note    Since last GI note: ERCP yesterday with Dr. Fuller Plan, sphincterotomy and stone removal.  + purulence present.  He has no serious abd pains, +flatus, tolerated liquid tray.  Slept poorly but not from pains  Objective: Vital signs in last 24 hours: Temp:  [97.8 F (36.6 C)-98.5 F (36.9 C)] 98 F (36.7 C) (01/30 0545) Pulse Rate:  [54-79] 55 (01/30 0545) Resp:  [16-25] 18 (01/30 0545) BP: (92-133)/(61-75) 119/61 mmHg (01/30 0545) SpO2:  [97 %-100 %] 97 % (01/30 0545) Last BM Date: 09/18/15 General: alert and oriented times 3 Heart: regular rate and rythm Abdomen: soft, non-tender, non-distended, normal bowel sounds   Lab Results:  Recent Labs  09/18/15 0342 09/19/15 0510  WBC 17.4* 6.7  HGB 13.3 12.6*  PLT 272 263  MCV 94.0 94.3    Recent Labs  09/18/15 0342 09/19/15 0510 09/20/15 0530  NA 139 138 138  K 4.4 3.9 4.4  CL 105 107 107  CO2 22 23 22   GLUCOSE 142* 117* 152*  BUN 33* 21* 29*  CREATININE 1.33* 1.08 1.42*  CALCIUM 9.1 8.6* 8.5*    Recent Labs  09/18/15 0342 09/19/15 0510 09/20/15 0530  PROT 6.2* 5.7* 5.4*  ALBUMIN 3.0* 2.6* 2.3*  AST 116* 116* 86*  ALT 186* 154* 131*  ALKPHOS 617* 533* 490*  BILITOT 5.5* 6.2* 3.7*   Studies/Results: Dg Ercp Biliary & Pancreatic Ducts  09/19/2015  CLINICAL DATA:  Common bile duct dilatation and known gallstones EXAM: ERCP TECHNIQUE: Multiple spot images obtained with the fluoroscopic device and submitted for interpretation post-procedure. FLUOROSCOPY TIME:  Radiation Exposure Index (as provided by the fluoroscopic device): 36.88 mGy If the device does not provide the exposure index: Fluoroscopy Time:  2 minutes 30 seconds Number of Acquired Images:  11 COMPARISON:  09/18/2015 FINDINGS: Initial images demonstrate a wire extending into the common bile duct. Dilatation of the common bile duct is noted. A sphincterotomy was performed and the balloon sweep of the duct was  performed. By history this produced a common bile duct stone. IMPRESSION: Common bile duct dilatation secondary to distal common bile duct stone. This was removed during the exam. These images were submitted for radiologic interpretation only. Please see the procedural report for the amount of contrast and the fluoroscopy time utilized. Electronically Signed   By: Inez Catalina M.D.   On: 09/19/2015 09:26     Medications: Scheduled Meds: . antiseptic oral rinse  7 mL Mouth Rinse q12n4p  . chlorhexidine  15 mL Mouth Rinse BID  . dutasteride  0.5 mg Oral Daily  . indomethacin  100 mg Rectal Once  . insulin aspart  0-15 Units Subcutaneous TID WC  . insulin aspart  0-5 Units Subcutaneous QHS  . pantoprazole  40 mg Oral BID  . piperacillin-tazobactam (ZOSYN)  IV  3.375 g Intravenous 3 times per day  . rosuvastatin  10 mg Oral q1800   Continuous Infusions: . sodium chloride 75 mL/hr at 09/19/15 2109   PRN Meds:.acetaminophen **OR** acetaminophen, ALPRAZolam, alum & mag hydroxide-simeth, fentaNYL (SUBLIMAZE) injection, HYDROcodone-acetaminophen, meperidine (DEMEROL) injection, morphine injection, ondansetron **OR** ondansetron (ZOFRAN) IV, ondansetron (ZOFRAN) IV, oxyCODONE    Assessment/Plan: 75 y.o. male with gallstone disease  ERCP yesterday with biliary sphincterotomy and stone removal.  No signs of post ERCP complication. We will contact general surgery about timing of GB resection.  From my perspective he will be OK to d/c home later today or tomorrow if not having GB operation  this admit.  Will advance diet for now.  Will also change to PO antibiotics which he should take for another 5 days total.  He is asking about dietary advise, please have dietary see him about low fat diet rules.  Please call or page with any further questions or concerns.  He can follow up with Dr. Earlean Shawl for his routine GI issues.   Milus Banister, MD  09/20/2015, 8:20 AM [facility] Pager  [phone]

## 2015-09-20 NOTE — Anesthesia Preprocedure Evaluation (Signed)
Anesthesia Evaluation  Patient identified by MRN, date of birth, ID band Patient awake    Reviewed: Allergy & Precautions, NPO status , Patient's Chart, lab work & pertinent test results  History of Anesthesia Complications Negative for: history of anesthetic complications  Airway Mallampati: II  TM Distance: >3 FB Neck ROM: Full    Dental no notable dental hx. (+) Dental Advisory Given   Pulmonary neg pulmonary ROS,    Pulmonary exam normal breath sounds clear to auscultation       Cardiovascular hypertension, Pt. on medications Normal cardiovascular exam Rhythm:Regular Rate:Normal     Neuro/Psych negative neurological ROS  negative psych ROS   GI/Hepatic negative GI ROS, Neg liver ROS,   Endo/Other  diabetes, Oral Hypoglycemic Agentsobesity  Renal/GU negative Renal ROS  negative genitourinary   Musculoskeletal negative musculoskeletal ROS (+)   Abdominal   Peds negative pediatric ROS (+)  Hematology negative hematology ROS (+)   Anesthesia Other Findings   Reproductive/Obstetrics negative OB ROS                             Anesthesia Physical Anesthesia Plan  ASA: II  Anesthesia Plan: General   Post-op Pain Management:    Induction: Intravenous  Airway Management Planned: Oral ETT  Additional Equipment:   Intra-op Plan:   Post-operative Plan: Extubation in OR  Informed Consent: I have reviewed the patients History and Physical, chart, labs and discussed the procedure including the risks, benefits and alternatives for the proposed anesthesia with the patient or authorized representative who has indicated his/her understanding and acceptance.   Dental advisory given  Plan Discussed with: CRNA  Anesthesia Plan Comments:         Anesthesia Quick Evaluation

## 2015-09-20 NOTE — Consult Note (Signed)
[facility] Consult Note  CLEM WISENBAKER January 23, 1941  [id number].    Requesting MD: Dr. Rama/Dr. 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 The patient has no family hx of biliary tract disease. He had abdominal pain and chills approximately 3 days prior to admission, which resolved spontaneously.  The night before admission, he had the onset of right flank pain that then became right anterior abdominal pain, as well as some mild chills, but no fevers.  10/10 sharp abdominal pain on admission.  No history of dark urine or acholic stools.  No precipitating/alleviating factors, no radicular pain.  He presented to the [facility], where he was found to have significant elevation of LFT's, with bilirubin of 5.5 and transaminases 116/186, alkaline phosphatase 617. WBC 17,400, but he was afebrile.  He thought it was a kidney stone, but the CT of the abdomen showed a distal common duct stone as well as both intrahepatic and extrahepatic biliary ductal dilatation.  A subsequent ultrasound has shown that he also has cholelithiasis.  Of note the patient has a large right para-midine scar on his abdomen which he says was for "internal bleeding" as a child.  He does not know if he had a bowel resection.  Yesterday 09/19/15 he underwent ERCP with sphincterotomy/papillotomy, and stone removal under anesthesia.  He has been pain-free, no current N/V.  His LFT are improving.  Bili down to 3.7.      ROS: All systems reviewed and otherwise negative except for as above  Family History  Problem Relation Age of Onset  . Dementia Mother     Past Medical History  Diagnosis Date  . Hypertension   . Diabetes mellitus   . Renal disorder   . Nephrolithiasis   . Hyperlipidemia associated with type 2 diabetes mellitus [facility])     Past Surgical History  Procedure  Laterality Date  . Knee arthroscopy Left   . Lumbar fusion      L4-L5    Social History:  reports that he has never smoked. He does not have any smokeless tobacco history on file. He reports that he drinks alcohol. He reports that he does not use illicit drugs.  Allergies:  Allergies  Allergen Reactions  . Chocolate     Throat breaks out    Medications Prior to Admission  Medication Sig Dispense Refill  . ALPRAZolam (XANAX) 0.5 MG tablet Take 0.5 mg by mouth 2 (two) times daily as needed for anxiety.     Marland Kitchen aspirin EC 81 MG tablet Take 81 mg by mouth daily.    Marland Kitchen dutasteride (AVODART) 0.5 MG capsule Take 0.5 mg by mouth daily.    . ergocalciferol (VITAMIN D2) 50000 units capsule Take 50,000 Units by mouth once a week.    Marland Kitchen lisinopril (PRINIVIL,ZESTRIL) 40 MG tablet Take 40 mg by mouth daily.    . pioglitazone (ACTOS) 15 MG tablet Take 15 mg by mouth daily.    . rosuvastatin (CRESTOR) 10 MG tablet Take 10 mg by mouth daily.    . traMADol (ULTRAM) 50 MG tablet Take 50 mg by mouth every 12 (twelve) hours as needed for moderate pain.    Marland Kitchen triamcinolone cream (KENALOG) 0.1 % Apply 1 application topically 2 (two) times daily as needed (itching).     . predniSONE (DELTASONE) 5 MG tablet Take 5-30 mg by mouth as directed. Reported on  09/18/2015      Blood pressure 119/61, pulse 55, temperature 98 F (36.7 C), temperature source Oral, resp. rate 18, height 6' (1.829 m), weight 105.325 kg (232 lb 3.2 oz), SpO2 97 %. Physical Exam: General: pleasant, but anxious WD/WN white male who is laying in bed in NAD HEENT: head is normocephalic, atraumatic.  Sclera are noninjected, but icteric.  PERRL.  Ears and nose without any masses or lesions.  Mouth is pink and moist.  Hearing aids in place. Heart: regular, rate, and rhythm.  No obvious murmurs, gallops, or rubs noted.  Palpable pedal pulses bilaterally Lungs: CTAB, no wheezes, rhonchi, or rales noted.  Respiratory effort nonlabored. Abd: soft,  NT/ND, +BS, no masses, hernias, or organomegaly, large right paramidline scar (from "internal bleeding" surgery as a child) MS: all 4 extremities are symmetrical with no cyanosis, clubbing, b/l LE edema mild Skin: warm and dry with no masses, lesions, or rashes Psych: A&Ox3 with an appropriate affect.   Results for orders placed or performed during the hospital encounter of 09/18/15 (from the past 48 hour(s))  Glucose, capillary     Status: Abnormal   Collection Time: 09/18/15 11:49 AM  Result Value Ref Range   Glucose-Capillary 121 (H) 65 - 99 mg/dL  Glucose, capillary     Status: None   Collection Time: 09/18/15  4:28 PM  Result Value Ref Range   Glucose-Capillary 94 65 - 99 mg/dL  Glucose, capillary     Status: None   Collection Time: 09/18/15  8:07 PM  Result Value Ref Range   Glucose-Capillary 77 65 - 99 mg/dL  Glucose, capillary     Status: Abnormal   Collection Time: 09/19/15 12:26 AM  Result Value Ref Range   Glucose-Capillary 101 (H) 65 - 99 mg/dL  Glucose, capillary     Status: Abnormal   Collection Time: 09/19/15  3:56 AM  Result Value Ref Range   Glucose-Capillary 100 (H) 65 - 99 mg/dL  Comprehensive metabolic panel     Status: Abnormal   Collection Time: 09/19/15  5:10 AM  Result Value Ref Range   Sodium 138 135 - 145 mmol/L   Potassium 3.9 3.5 - 5.1 mmol/L   Chloride 107 101 - 111 mmol/L   CO2 23 22 - 32 mmol/L   Glucose, Bld 117 (H) 65 - 99 mg/dL   BUN 21 (H) 6 - 20 mg/dL   Creatinine, Ser 1.08 0.61 - 1.24 mg/dL   Calcium 8.6 (L) 8.9 - 10.3 mg/dL   Total Protein 5.7 (L) 6.5 - 8.1 g/dL   Albumin 2.6 (L) 3.5 - 5.0 g/dL   AST 116 (H) 15 - 41 U/L   ALT 154 (H) 17 - 63 U/L   Alkaline Phosphatase 533 (H) 38 - 126 U/L   Total Bilirubin 6.2 (H) 0.3 - 1.2 mg/dL   GFR calc non Af Amer >60 >60 mL/min   GFR calc Af Amer >60 >60 mL/min    Comment: (NOTE) The eGFR has been calculated using the CKD EPI equation. This calculation has not been validated in all clinical  situations. eGFR's persistently <60 mL/min signify possible Chronic Kidney Disease.    Anion gap 8 5 - 15  CBC     Status: Abnormal   Collection Time: 09/19/15  5:10 AM  Result Value Ref Range   WBC 6.7 4.0 - 10.5 K/uL   RBC 4.06 (L) 4.22 - 5.81 MIL/uL   Hemoglobin 12.6 (L) 13.0 - 17.0 g/dL   HCT 38.3 (L)  39.0 - 52.0 %   MCV 94.3 78.0 - 100.0 fL   MCH 31.0 26.0 - 34.0 pg   MCHC 32.9 30.0 - 36.0 g/dL   RDW 15.5 11.5 - 15.5 %   Platelets 263 150 - 400 K/uL  Type and screen     Status: None   Collection Time: 09/19/15  5:10 AM  Result Value Ref Range   ABO/RH(D) A POS    Antibody Screen NEG    Sample Expiration 09/22/2015   ABO/Rh     Status: None   Collection Time: 09/19/15  5:10 AM  Result Value Ref Range   ABO/RH(D) A POS   Glucose, capillary     Status: Abnormal   Collection Time: 09/19/15  7:28 AM  Result Value Ref Range   Glucose-Capillary 117 (H) 65 - 99 mg/dL  Glucose, capillary     Status: Abnormal   Collection Time: 09/19/15  9:11 AM  Result Value Ref Range   Glucose-Capillary 103 (H) 65 - 99 mg/dL  Glucose, capillary     Status: Abnormal   Collection Time: 09/19/15 12:03 PM  Result Value Ref Range   Glucose-Capillary 190 (H) 65 - 99 mg/dL  Glucose, capillary     Status: Abnormal   Collection Time: 09/19/15  4:03 PM  Result Value Ref Range   Glucose-Capillary 168 (H) 65 - 99 mg/dL  Glucose, capillary     Status: Abnormal   Collection Time: 09/19/15  8:18 PM  Result Value Ref Range   Glucose-Capillary 197 (H) 65 - 99 mg/dL  Glucose, capillary     Status: Abnormal   Collection Time: 09/19/15 11:53 PM  Result Value Ref Range   Glucose-Capillary 129 (H) 65 - 99 mg/dL  Glucose, capillary     Status: Abnormal   Collection Time: 09/20/15  4:06 AM  Result Value Ref Range   Glucose-Capillary 147 (H) 65 - 99 mg/dL  Comprehensive metabolic panel     Status: Abnormal   Collection Time: 09/20/15  5:30 AM  Result Value Ref Range   Sodium 138 135 - 145 mmol/L    Potassium 4.4 3.5 - 5.1 mmol/L   Chloride 107 101 - 111 mmol/L   CO2 22 22 - 32 mmol/L   Glucose, Bld 152 (H) 65 - 99 mg/dL   BUN 29 (H) 6 - 20 mg/dL   Creatinine, Ser 1.42 (H) 0.61 - 1.24 mg/dL   Calcium 8.5 (L) 8.9 - 10.3 mg/dL   Total Protein 5.4 (L) 6.5 - 8.1 g/dL   Albumin 2.3 (L) 3.5 - 5.0 g/dL   AST 86 (H) 15 - 41 U/L   ALT 131 (H) 17 - 63 U/L   Alkaline Phosphatase 490 (H) 38 - 126 U/L   Total Bilirubin 3.7 (H) 0.3 - 1.2 mg/dL   GFR calc non Af Amer 47 (L) >60 mL/min   GFR calc Af Amer 55 (L) >60 mL/min    Comment: (NOTE) The eGFR has been calculated using the CKD EPI equation. This calculation has not been validated in all clinical situations. eGFR's persistently <60 mL/min signify possible Chronic Kidney Disease.    Anion gap 9 5 - 15  Glucose, capillary     Status: Abnormal   Collection Time: 09/20/15  8:12 AM  Result Value Ref Range   Glucose-Capillary 123 (H) 65 - 99 mg/dL   Dg Ercp Biliary & Pancreatic Ducts  09/19/2015  CLINICAL DATA:  Common bile duct dilatation and known gallstones EXAM: ERCP TECHNIQUE: Multiple spot images  obtained with the fluoroscopic device and submitted for interpretation post-procedure. FLUOROSCOPY TIME:  Radiation Exposure Index (as provided by the fluoroscopic device): 36.88 mGy If the device does not provide the exposure index: Fluoroscopy Time:  2 minutes 30 seconds Number of Acquired Images:  11 COMPARISON:  09/18/2015 FINDINGS: Initial images demonstrate a wire extending into the common bile duct. Dilatation of the common bile duct is noted. A sphincterotomy was performed and the balloon sweep of the duct was performed. By history this produced a common bile duct stone. IMPRESSION: Common bile duct dilatation secondary to distal common bile duct stone. This was removed during the exam. These images were submitted for radiologic interpretation only. Please see the procedural report for the amount of contrast and the fluoroscopy time utilized.  Electronically Signed   By: Inez Catalina M.D.   On: 09/19/2015 09:26      Assessment/Plan RUQ abdominal pain with N/V Choledocholithiasis with cholangitis s/p ERCP with sphincterotomy and stone removal -The patient is in need of a lap chole with possible IOC.  It is recommended to have this during this admission because of increased risk of repeat episodes, sepsis, or pancreatitis.  He is anxious about having two procedures back to back and he's wanting to get back to work quickly.  We discussed the anatomy, the pre/peri/post operative care, expectations for discharge home, and home care.  We discussed the operative risks and benefits.  He is now willing to proceed with surgery today or tomorrow when Dr. Marlou Starks feels is best.  He only drank a few sips of liquids earlier this am, he did not get his breakfast tray, and is currently NPO -IVF, pain control, antiemetics, antibiotics (rocephin pre-op prophylaxis, on augmentin for OP) -SCD's  -Ambulate and IS -Appreciate medicines help in peri-operative medical management of his multiple medical problems -Plan for surgery today, hopefully home tomorrow   Nat Christen, [facility] 09/20/2015, 10:12 AM Pager: [phone]

## 2015-09-20 NOTE — Anesthesia Postprocedure Evaluation (Signed)
Anesthesia Post Note  Patient: Gary Roach  Procedure(s) Performed: Procedure(s) (LRB): DIAGNOSTIC LAPAROSCOPY, LYSIS OF AD HESIONS CONVERTED TO OPEN CHOLECYSTECTOMY  (N/A) LAPAROSCOPIC LYSIS OF ADHESIONS  Patient location during evaluation: PACU Anesthesia Type: General Level of consciousness: awake and alert Pain management: pain level controlled Vital Signs Assessment: post-procedure vital signs reviewed and stable Respiratory status: spontaneous breathing, nonlabored ventilation, respiratory function stable and patient connected to nasal cannula oxygen Cardiovascular status: blood pressure returned to baseline and stable Postop Assessment: no signs of nausea or vomiting Anesthetic complications: no    Last Vitals:  Filed Vitals:   09/20/15 1700 09/20/15 1715  BP: 127/73 126/73  Pulse: 81 83  Temp:    Resp: 23 21    Last Pain:  Filed Vitals:   09/20/15 1718  PainSc: 6                  Zenaida Deed

## 2015-09-20 NOTE — Progress Notes (Addendum)
Progress Note   Gary Roach [id number] DOB: [birth year] DOA: October 11, 2015 PCP: Sheela Stack, MD   Brief Narrative:   Gary Roach is an 75 y.o. male with a PMH of hypertension and diabetes, prior nephrolithiasis who was admitted Oct 11, 2015 with a chief complaint of the sudden onset of right flank pain accompanied by nausea and vomiting.  Assessment/Plan:   Principal Problem:  Choledocholithiasis with cholangitis with obstruction - Change Zosyn to Augmentin. - S/P ERCP with sphincterotomy & CBD stone removal. - LFTs improving. - Surgery consulted by GI for consideration of cholecystectomy. Possibly will have done today.    Duodenitis - PPI x 2 months.  No NSAIDS/ASA. - F/U with Dr. Earlean Shawl.  Active Problems:  Hypertension - Hold lisinopril given bump in creatinine.   Nephrolithiasis - Continue Avodart.   Hyperlipidemia associated with type 2 diabetes mellitus (HCC) - Continue Crestor.   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 Creatinine slightly higher today. Increase IV fluids to 100 mL/hour - Continue SSI, change to moderate scale Q AC/HS. - CBGs [id number]. Dextrose removed from IV fluids.   DVT prophylaxis - SCDs ordered. Hold Lovenox for now.   Family Communication/Anticipated D/C date and plan/Code Status   Family Communication: No family at the bedside. Disposition Plan: Home when LFTs improved and gallstone extracted. Anticipated D/C date:   09/20/15 if LFTs improved & cleared for D/C by GI. Code Status:  Full code.   IV Access:    Peripheral IV   Procedures and diagnostic studies:   Ct Renal Stone Study  2015-10-11  CLINICAL DATA:  Right flank pain, nausea, and vomiting with sudden onset last night. History of kidney stones. EXAM: CT ABDOMEN AND PELVIS WITHOUT CONTRAST TECHNIQUE: Multidetector CT imaging of the abdomen and pelvis was performed following  the standard protocol without IV contrast. COMPARISON:  06/28/2009 FINDINGS: Atelectasis in the lung bases. 15 mm stone in the lower pole right kidney. No hydronephrosis or hydroureter on either side. No ureteral stones or bladder stones identified. Bladder wall is not thickened. There is a stone in the distal common bile duct with prominent dilatation of the intra and extrahepatic bile ducts. Several gallstones are also present. No obvious gallbladder wall thickening. Fatty infiltration of the pancreas. The spleen, adrenal glands, abdominal aorta, inferior vena cava, and retroperitoneal lymph nodes are unremarkable. Small esophageal hiatal hernia. Stomach, small bowel, and colon are not abnormally distended. Scattered diverticula in the colon. No free air or free fluid in the abdomen. Abdominal wall musculature appears intact. Prominent visceral adipose tissues. Pelvis: Fat in the inguinal canals bilaterally. The appendix is not identified. Scattered diverticula in the sigmoid colon without evidence of diverticulitis. Prostate gland is enlarged, measuring 5.1 cm diameter. Prostatic impression upon the bladder base. Bladder wall is not thickened. The no free or loculated pelvic fluid collections. Degenerative changes in the spine. No destructive bone lesions. IMPRESSION: Nonobstructing stone in the lower pole right kidney. There is an obstructing stone in the distal common bile duct with proximal intra and extrahepatic bile duct dilatation. Cholelithiasis. Enlarged prostate gland. Electronically Signed   By: Lucienne Capers M.D.   On: 10/11/15 02:02   US Abdomen Limited Ruq  2015-10-11  CLINICAL DATA:  Obstructing stone at the distal common bile duct on CT. Further evaluation requested. Initial encounter. EXAM: US ABDOMEN LIMITED - RIGHT UPPER QUADRANT COMPARISON:  CT of the abdomen and pelvis performed earlier today at 1:51 a.m.  FINDINGS: Gallbladder: Stones are again noted within the gallbladder, measuring  up to 2.2 cm in size. No definite gallbladder wall thickening or pericholecystic fluid is seen. No ultrasonographic Murphy's sign is elicited. Common bile duct: Diameter: 1.1 cm; dilated, as noted on CT, reflecting the distal obstructing stone. The known obstructing stone is not seen on this study due to overlying bowel gas. Liver: No focal lesion identified. Intrahepatic biliary duct dilatation is better characterized on recent CT. Within normal limits in parenchymal echogenicity. IMPRESSION: 1. Dilatation of the common bile duct and intrahepatic biliary ductal dilatation, reflecting the obstructing stone at the distal common bile duct as seen on CT. The known obstructing stone is not characterized on this study due to overlying bowel gas. 2. Underlying cholelithiasis.  No evidence for cholecystitis. Electronically Signed   By: Garald Balding M.D.   On: 09/18/2015 06:36     Medical Consultants:    Gastroenterology: Ronald Lobo, MD  Surgery  Anti-Infectives:   Zosyn 09/18/15--->09/20/15 Augmentin 09/20/15   Subjective:   Gary Roach denies any abdominal pain, N/V/D.  Remains a bit anxious.    Objective:    Filed Vitals:   09/19/15 1047 09/19/15 1305 09/19/15 2110 09/20/15 0545  BP: 124/64 129/71 113/61 119/61  Pulse: 55 58 54 55  Temp: 98.2 F (36.8 C) 97.8 F (36.6 C) 98.5 F (36.9 C) 98 F (36.7 C)  TempSrc: Oral Oral Oral Oral  Resp: 16 16 18 18   Height:      Weight:      SpO2: 100% 100% 99% 97%    Intake/Output Summary (Last 24 hours) at 09/20/15 0732 Last data filed at 09/20/15 0545  Gross per 24 hour  Intake 1763.75 ml  Output    925 ml  Net 838.75 ml   Filed Weights   09/18/15 0855  Weight: 105.325 kg (232 lb 3.2 oz)    Exam: Gen:  NAD Cardiovascular:  RRR, No M/R/G Respiratory:  Lungs CTAB Gastrointestinal:  Abdomen soft, NT/ND, + BS Extremities:  No C/E/C   Data Reviewed:    Labs: Basic Metabolic Panel:  Recent Labs Lab 09/18/15 0342  09/19/15 0510 09/20/15 0530  NA 139 138 138  K 4.4 3.9 4.4  CL 105 107 107  CO2 22 23 22   GLUCOSE 142* 117* 152*  BUN 33* 21* 29*  CREATININE 1.33* 1.08 1.42*  CALCIUM 9.1 8.6* 8.5*   GFR Estimated Creatinine Clearance: 57.3 mL/min (by C-G formula based on Cr of 1.42). Liver Function Tests:  Recent Labs Lab 09/18/15 0342 09/19/15 0510 09/20/15 0530  AST 116* 116* 86*  ALT 186* 154* 131*  ALKPHOS 617* 533* 490*  BILITOT 5.5* 6.2* 3.7*  PROT 6.2* 5.7* 5.4*  ALBUMIN 3.0* 2.6* 2.3*   CBC:  Recent Labs Lab 09/18/15 0342 09/19/15 0510  WBC 17.4* 6.7  NEUTROABS 15.3*  --   HGB 13.3 12.6*  HCT 40.4 38.3*  MCV 94.0 94.3  PLT 272 263   CBG:  Recent Labs Lab 09/19/15 1203 09/19/15 1603 09/19/15 2018 09/19/15 2353 09/20/15 0406  GLUCAP 190* 168* 197* 129* 147*   Microbiology No results found for this or any previous visit (from the past 240 hour(s)).   Medications:   . antiseptic oral rinse  7 mL Mouth Rinse q12n4p  . chlorhexidine  15 mL Mouth Rinse BID  . dutasteride  0.5 mg Oral Daily  . indomethacin  100 mg Rectal Once  . insulin aspart  0-9 Units Subcutaneous 6 times per day  .  lisinopril  40 mg Oral Daily  . pantoprazole  40 mg Oral BID  . piperacillin-tazobactam (ZOSYN)  IV  3.375 g Intravenous 3 times per day  . rosuvastatin  10 mg Oral q1800   Continuous Infusions: . sodium chloride 75 mL/hr at 09/19/15 2109  . lactated ringers      Time spent: 25 minutes.   LOS: 2 days   RAMA,CHRISTINA  [facility] Pager [phone]. If unable to reach me by pager, please call my cell phone at [phone].  *Please refer to [email or web address], password [email or web address] to get updated schedule on who will round on this patient, as hospitalists switch teams weekly. If 7PM-7AM, please contact night-coverage at [email or web address], password [email or web address] for any overnight needs.  09/20/2015, 7:32 AM

## 2015-09-20 NOTE — Transfer of Care (Signed)
Immediate Anesthesia Transfer of Care Note  Patient: [name]  Procedure(s) Performed: Procedure(s): DIAGNOSTIC LAPAROSCOPY, LYSIS OF AD HESIONS CONVERTED TO OPEN CHOLECYSTECTOMY  (N/A) LAPAROSCOPIC LYSIS OF ADHESIONS  Patient Location: PACU  Anesthesia Type:General  Level of Consciousness:  sedated, patient cooperative and responds to stimulation  Airway & Oxygen Therapy:Patient Spontanous Breathing and Patient connected to face mask oxgen  Post-op Assessment:  Report given to PACU RN and Post -op Vital signs reviewed and stable  Post vital signs:  Reviewed and stable  Last Vitals:  Filed Vitals:   09/20/15 0545 09/20/15 1646  BP: 119/61   Pulse: 55   Temp: 36.7 C 36.5 C  Resp: 18 18    Complications: No apparent anesthesia complications

## 2015-09-20 NOTE — Op Note (Addendum)
09/18/2015 - 09/20/2015  4:31 PM  PATIENT:  Gary Roach  75 y.o. male  PRE-OPERATIVE DIAGNOSIS:  Choledocholithiasis, cholelithiasis  POST-OPERATIVE DIAGNOSIS:  Choledocholithiasis, cholelithiasis  PROCEDURE:  Procedure(s): Aborted LAPAROSCOPIC and subsequent Open CHOLECYSTECTOMY with lysis of adhesions  SURGEON:  Surgeon(s) and Role:    * Jovita Kussmaul, MD - Primary    * Mickeal Skinner, MD - Assisting  PHYSICIAN ASSISTANT:   ASSISTANTS: Dr. Kieth Brightly   ANESTHESIA:   general  EBL:  Total I/O In: 1000 [I.V.:1000] Out: 300 [Urine:300]  BLOOD ADMINISTERED:none  DRAINS: none   LOCAL MEDICATIONS USED:  MARCAINE     SPECIMEN:  Source of Specimen:  gallbladder  DISPOSITION OF SPECIMEN:  PATHOLOGY  COUNTS:  YES  TOURNIQUET:  * No tourniquets in log *  DICTATION: .Dragon Dictation   After informed consent was obtained the patient was brought to the operating room and placed in the supine position on the operating room table. After adequate induction of general anesthesia the patient's abdomen was prepped with ChloraPrep, allowed to dry, and draped in usual sterile manner. A site was initially chosen in the left upper quadrant for access and the abdominal cavity. This area was infiltrated with quarter percent Marcaine. A small stab incision was made with a 15 blade knife. A 5 mm Optiview port with camera was used to bluntly dissect through the layers of the abdominal wall through this incision until access was gained to the abdominal cavity. The abdomen was then insufflated with carbon dioxide without difficulty. The abdomen was inspected and there were significant adhesions to the upper midline of the abdominal wall. A second 5 mm port was placed in the left mid abdomen under direct vision. Laparoscopic scissors was then used to take the adhesions down from the anterior abdominal wall. This was very careful and meticulous dissection that took nearly 1 hour. Most of these  adhesions were omentum but there was one loop of small bowel that was adherent but we were easily able to bluntly dissect this loop down off the abdominal wall and then dissect the filmy adhesions sharply with the laparoscopic scissors. Once this was accomplished we were then able to examine the right upper quadrant. Because of the patient's previous surgery we could not safely identify one structure from another as far as the stomach duodenum colon and gallbladder were concerned. At this point we made a right subcostal incision with a 10 blade knife. The incision was carried through the skin and subcutaneous tissue sharply with electrocautery until the fascia of the abdominal wall was encountered. The fascia and muscle layers of the abdominal wall were incised sharply with the electrocautery until the peritoneum was opened and access was gained to the abdominal cavity. The rest of the incision was opened under direct vision with the electrocautery. A Bookwalter retractor was deployed. We were then able to identify the gallbladder. The adhesions around the gallbladder were taken down by blunt right angle dissection and some sharp dissection with the electrocautery. Once all the adhesions were separated from the body of the gallbladder we took the gallbladder off the liver using a top down technique with blunt right angle dissection as well as sharp dissection with the electrocautery. The cystic artery was identified and controlled with clips. The cystic duct was also identified at its junction with the gallbladder neck. The cystic duct was controlled with a 2-0 silk stitch and large clip. The gallbladder neck was then divided and the gallbladder was removed from  the patient. The area was then irrigated with copious amounts of saline. The area was examined and found to be hemostatic. A piece of Surgicel was placed on the liver bed. The sponges and retractors were then removed. The adhesions that were taken down from  the abdominal wall were examined and appeared to be hemostatic. There was no evidence of any bowel injury. The fascia of the abdominal wall was then closed with 2 layers of running #1 PDS suture. The layers were irrigated with copious amounts of saline. The skin was closed with staples. Betadine ointment and sterile dressings were applied. The patient tolerated the procedure well. At the end of the case all needle sponge and his counts were correct. The patient was then awakened and taken to recovery in stable condition.  PLAN OF CARE: Admit to inpatient   PATIENT DISPOSITION:  PACU - hemodynamically stable.   Delay start of Pharmacological VTE agent (>24hrs) due to surgical blood loss or risk of bleeding: no

## 2015-09-20 NOTE — Anesthesia Procedure Notes (Signed)
Procedure Name: Intubation Date/Time: 09/20/2015 2:24 PM Performed by: Deliah Boston Pre-anesthesia Checklist: Patient identified, Emergency Drugs available, Suction available and Patient being monitored Patient Re-evaluated:Patient Re-evaluated prior to inductionOxygen Delivery Method: Circle System Utilized Preoxygenation: Pre-oxygenation with 100% oxygen Intubation Type: IV induction Ventilation: Mask ventilation without difficulty Laryngoscope Size: Mac and 4 Grade View: Grade I Tube type: Oral Tube size: 8.0 mm Number of attempts: 1 Airway Equipment and Method: Oral airway Placement Confirmation: ETT inserted through vocal cords under direct vision,  positive ETCO2 and breath sounds checked- equal and bilateral Secured at: 21 cm Tube secured with: Tape Dental Injury: Teeth and Oropharynx as per pre-operative assessment

## 2015-09-20 NOTE — Discharge Summary (Deleted)
Physician Discharge Summary  Gary Roach [id number] DOB: February 26, 1941 DOA: 09/18/2015  PCP: Sheela Stack, MD  Admit date: 09/18/2015 Discharge date: 09/20/2015   Recommendations for Outpatient Follow-Up:   1. F/U with general surgery as an outpatient to schedule elective cholecystectomy (patient declines inpatient surgery at this time).   Discharge Diagnosis:   Principal Problem:    Acute cholangitis Active Problems:    Choledocholithiasis    Hypertension    Nephrolithiasis    Hyperlipidemia associated with type 2 diabetes mellitus (HCC)    Type 2 diabetes mellitus (HCC)    Acute kidney injury ([place])    Right flank pain    Elevated LFTs    Choledocholithiasis with obstruction    Duodenitis    Common bile duct calculi   Discharge disposition:  Home.   Discharge Condition: Improved.  Diet recommendation: Low sodium, heart healthy.  Carbohydrate-modified.     History of Present Illness:   Gary Roach is an 75 y.o. male with a PMH of hypertension and diabetes, prior nephrolithiasis who was admitted 09/18/15 with a chief complaint of the sudden onset of right flank pain accompanied by nausea and vomiting.  Hospital Course by Problem:   Principal Problem:  Choledocholithiasis with cholangitis with obstruction - D/C on Augmentin x 5 more days. - S/P ERCP with sphincterotomy & CBD stone removal. - LFTs improving. - Surgery consulted by GI for consideration of cholecystectomy. Declined inpatient surgery. Will F/U as outpatient to schedule.   Duodenitis - PPI x 2 months. No NSAIDS/ASA. - F/U with Dr. Earlean Shawl.  Active Problems:  Hypertension - Hold lisinopril given bump in creatinine.   Nephrolithiasis - Continue Avodart.   Hyperlipidemia associated with type 2 diabetes mellitus (HCC) - Continue Crestor.   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  Resume  Actos.    Medical Consultants:     Gastroenterology: Ronald Lobo, MD  Surgery  Discharge Exam:   Filed Vitals:   09/19/15 2110 09/20/15 0545  BP: 113/61 119/61  Pulse: 54 55  Temp: 98.5 F (36.9 C) 98 F (36.7 C)  Resp: 18 18   Filed Vitals:   09/19/15 1047 09/19/15 1305 09/19/15 2110 09/20/15 0545  BP: 124/64 129/71 113/61 119/61  Pulse: 55 58 54 55  Temp: 98.2 F (36.8 C) 97.8 F (36.6 C) 98.5 F (36.9 C) 98 F (36.7 C)  TempSrc: Oral Oral Oral Oral  Resp: 16 16 18 18   Height:      Weight:      SpO2: 100% 100% 99% 97%    Gen:  NAD Cardiovascular:  RRR, No M/R/G Respiratory: Lungs CTAB Gastrointestinal: Abdomen soft, NT/ND with normal active bowel sounds. Extremities: No C/E/C   The results of significant diagnostics from this hospitalization (including imaging, microbiology, ancillary and laboratory) are listed below for reference.     Procedures and Diagnostic Studies:   Dg Ercp Biliary & Pancreatic Ducts  09/19/2015  CLINICAL DATA:  Common bile duct dilatation and known gallstones EXAM: ERCP TECHNIQUE: Multiple spot images obtained with the fluoroscopic device and submitted for interpretation post-procedure. FLUOROSCOPY TIME:  Radiation Exposure Index (as provided by the fluoroscopic device): 36.88 mGy If the device does not provide the exposure index: Fluoroscopy Time:  2 minutes 30 seconds Number of Acquired Images:  11 COMPARISON:  09/18/2015 FINDINGS: Initial images demonstrate a wire extending into the common bile duct. Dilatation of the common bile duct is noted. A sphincterotomy was performed  and the balloon sweep of the duct was performed. By history this produced a common bile duct stone. IMPRESSION: Common bile duct dilatation secondary to distal common bile duct stone. This was removed during the exam. These images were submitted for radiologic interpretation only. Please see the procedural report for the amount of contrast and the fluoroscopy  time utilized. Electronically Signed   By: Inez Catalina M.D.   On: 09/19/2015 09:26   Ct Renal Stone Study  09/18/2015  CLINICAL DATA:  Right flank pain, nausea, and vomiting with sudden onset last night. History of kidney stones. EXAM: CT ABDOMEN AND PELVIS WITHOUT CONTRAST TECHNIQUE: Multidetector CT imaging of the abdomen and pelvis was performed following the standard protocol without IV contrast. COMPARISON:  06/28/2009 FINDINGS: Atelectasis in the lung bases. 15 mm stone in the lower pole right kidney. No hydronephrosis or hydroureter on either side. No ureteral stones or bladder stones identified. Bladder wall is not thickened. There is a stone in the distal common bile duct with prominent dilatation of the intra and extrahepatic bile ducts. Several gallstones are also present. No obvious gallbladder wall thickening. Fatty infiltration of the pancreas. The spleen, adrenal glands, abdominal aorta, inferior vena cava, and retroperitoneal lymph nodes are unremarkable. Small esophageal hiatal hernia. Stomach, small bowel, and colon are not abnormally distended. Scattered diverticula in the colon. No free air or free fluid in the abdomen. Abdominal wall musculature appears intact. Prominent visceral adipose tissues. Pelvis: Fat in the inguinal canals bilaterally. The appendix is not identified. Scattered diverticula in the sigmoid colon without evidence of diverticulitis. Prostate gland is enlarged, measuring 5.1 cm diameter. Prostatic impression upon the bladder base. Bladder wall is not thickened. The no free or loculated pelvic fluid collections. Degenerative changes in the spine. No destructive bone lesions. IMPRESSION: Nonobstructing stone in the lower pole right kidney. There is an obstructing stone in the distal common bile duct with proximal intra and extrahepatic bile duct dilatation. Cholelithiasis. Enlarged prostate gland. Electronically Signed   By: Lucienne Capers M.D.   On: 09/18/2015 02:02    US Abdomen Limited Ruq  09/18/2015  CLINICAL DATA:  Obstructing stone at the distal common bile duct on CT. Further evaluation requested. Initial encounter. EXAM: US ABDOMEN LIMITED - RIGHT UPPER QUADRANT COMPARISON:  CT of the abdomen and pelvis performed earlier today at 1:51 a.m. FINDINGS: Gallbladder: Stones are again noted within the gallbladder, measuring up to 2.2 cm in size. No definite gallbladder wall thickening or pericholecystic fluid is seen. No ultrasonographic Murphy's sign is elicited. Common bile duct: Diameter: 1.1 cm; dilated, as noted on CT, reflecting the distal obstructing stone. The known obstructing stone is not seen on this study due to overlying bowel gas. Liver: No focal lesion identified. Intrahepatic biliary duct dilatation is better characterized on recent CT. Within normal limits in parenchymal echogenicity. IMPRESSION: 1. Dilatation of the common bile duct and intrahepatic biliary ductal dilatation, reflecting the obstructing stone at the distal common bile duct as seen on CT. The known obstructing stone is not characterized on this study due to overlying bowel gas. 2. Underlying cholelithiasis.  No evidence for cholecystitis. Electronically Signed   By: Garald Balding M.D.   On: 09/18/2015 06:36     Labs:   Basic Metabolic Panel:  Recent Labs Lab 09/18/15 0342 09/19/15 0510 09/20/15 0530  NA 139 138 138  K 4.4 3.9 4.4  CL 105 107 107  CO2 22 23 22   GLUCOSE 142* 117* 152*  BUN 33* 21*  29*  CREATININE 1.33* 1.08 1.42*  CALCIUM 9.1 8.6* 8.5*   GFR Estimated Creatinine Clearance: 57.3 mL/min (by C-G formula based on Cr of 1.42). Liver Function Tests:  Recent Labs Lab 09/18/15 0342 09/19/15 0510 09/20/15 0530  AST 116* 116* 86*  ALT 186* 154* 131*  ALKPHOS 617* 533* 490*  BILITOT 5.5* 6.2* 3.7*  PROT 6.2* 5.7* 5.4*  ALBUMIN 3.0* 2.6* 2.3*   No results for input(s): LIPASE, AMYLASE in the last 168 hours. No results for input(s): AMMONIA in the  last 168 hours. Coagulation profile No results for input(s): INR, PROTIME in the last 168 hours.  CBC:  Recent Labs Lab 09/18/15 0342 09/19/15 0510  WBC 17.4* 6.7  NEUTROABS 15.3*  --   HGB 13.3 12.6*  HCT 40.4 38.3*  MCV 94.0 94.3  PLT 272 263     Discharge Instructions:   Discharge Instructions    Call MD for:  persistant nausea and vomiting    Complete by:  As directed      Call MD for:  severe uncontrolled pain    Complete by:  As directed      Call MD for:  temperature >100.4    Complete by:  As directed      Diet - low sodium heart healthy    Complete by:  As directed      Increase activity slowly    Complete by:  As directed             Medication List    STOP taking these medications        predniSONE 5 MG tablet  Commonly known as:  DELTASONE      TAKE these medications        ALPRAZolam 0.5 MG tablet  Commonly known as:  XANAX  Take 0.5 mg by mouth 2 (two) times daily as needed for anxiety.     amoxicillin-clavulanate 875-125 MG tablet  Commonly known as:  AUGMENTIN  Take 1 tablet by mouth every 12 (twelve) hours.     aspirin EC 81 MG tablet  Take 81 mg by mouth daily.     dutasteride 0.5 MG capsule  Commonly known as:  AVODART  Take 0.5 mg by mouth daily.     ergocalciferol 50000 units capsule  Commonly known as:  VITAMIN D2  Take 50,000 Units by mouth once a week.     lisinopril 40 MG tablet  Commonly known as:  PRINIVIL,ZESTRIL  Take 40 mg by mouth daily.     pantoprazole 40 MG tablet  Commonly known as:  PROTONIX  Take 1 tablet (40 mg total) by mouth 2 (two) times daily.     pioglitazone 15 MG tablet  Commonly known as:  ACTOS  Take 15 mg by mouth daily.     rosuvastatin 10 MG tablet  Commonly known as:  CRESTOR  Take 10 mg by mouth daily.     traMADol 50 MG tablet  Commonly known as:  ULTRAM  Take 50 mg by mouth every 12 (twelve) hours as needed for moderate pain.     triamcinolone cream 0.1 %  Commonly known as:   KENALOG  Apply 1 application topically 2 (two) times daily as needed (itching).           Follow-up Information    Schedule an appointment as soon as possible for a visit with Sheela Stack, MD.   Specialty:  Endocrinology   Why:  As needed for your routine regular follow up.  Contact information:   [address] [place] [address] [phone]        Time coordinating discharge: 35 minutes.  Signed:  Jahira Swiss  Pager [phone] [facility] 09/20/2015, 9:54 AM

## 2015-09-21 ENCOUNTER — Encounter (HOSPITAL_COMMUNITY): Payer: Self-pay | Admitting: General Surgery

## 2015-09-21 DIAGNOSIS — R339 Retention of urine, unspecified: Secondary | ICD-10-CM | POA: Diagnosis present

## 2015-09-21 LAB — COMPREHENSIVE METABOLIC PANEL
ALBUMIN: 2.5 g/dL — AB (ref 3.5–5.0)
ALK PHOS: 459 U/L — AB (ref 38–126)
ALT: 117 U/L — AB (ref 17–63)
AST: 80 U/L — AB (ref 15–41)
Anion gap: 10 (ref 5–15)
BILIRUBIN TOTAL: 2.7 mg/dL — AB (ref 0.3–1.2)
BUN: 31 mg/dL — AB (ref 6–20)
CALCIUM: 8.7 mg/dL — AB (ref 8.9–10.3)
CO2: 23 mmol/L (ref 22–32)
Chloride: 107 mmol/L (ref 101–111)
Creatinine, Ser: 1.54 mg/dL — ABNORMAL HIGH (ref 0.61–1.24)
GFR calc Af Amer: 50 mL/min — ABNORMAL LOW (ref >60)
GFR calc non Af Amer: 43 mL/min — ABNORMAL LOW (ref >60)
GLUCOSE: 123 mg/dL — AB (ref 65–99)
Potassium: 4.3 mmol/L (ref 3.5–5.1)
Sodium: 140 mmol/L (ref 135–145)
TOTAL PROTEIN: 5.5 g/dL — AB (ref 6.5–8.1)

## 2015-09-21 LAB — GLUCOSE, CAPILLARY
GLUCOSE-CAPILLARY: 101 mg/dL — AB (ref 65–99)
GLUCOSE-CAPILLARY: 122 mg/dL — AB (ref 65–99)
Glucose-Capillary: 116 mg/dL — ABNORMAL HIGH (ref 65–99)
Glucose-Capillary: 108 mg/dL — ABNORMAL HIGH (ref 65–99)

## 2015-09-21 MED ORDER — ENOXAPARIN SODIUM 40 MG/0.4ML ~~LOC~~ SOLN
40.0000 mg | SUBCUTANEOUS | Status: DC
  Administered 2015-09-21 – 2015-09-24 (×4): 40 mg via SUBCUTANEOUS
  Filled 2015-09-21 (×4): qty 0.4

## 2015-09-21 MED ORDER — DUTASTERIDE 0.5 MG PO CAPS
0.5000 mg | ORAL_CAPSULE | Freq: Every day | ORAL | Status: DC
  Administered 2015-09-21 – 2015-09-24 (×4): 0.5 mg via ORAL
  Filled 2015-09-21 (×4): qty 1

## 2015-09-21 NOTE — Care Management Important Message (Signed)
Important Message  Patient Details  Name: Gary Roach MRN: [id number] Date of Birth: [birth year]   Medicare Important Message Given:  Yes    Camillo Flaming 09/21/2015, 9:31 AMImportant Message  Patient Details  Name: Gary Roach MRN: [id number] Date of Birth: 08-12-1941   Medicare Important Message Given:  Yes    Camillo Flaming 09/21/2015, 9:31 AM

## 2015-09-21 NOTE — Plan of Care (Signed)
Problem: Food- and Nutrition-Related Knowledge Deficit (NB-1.1) Goal: Nutrition education Formal process to instruct or train a patient/client in a skill or to impart knowledge to help patients/clients voluntarily manage or modify food choices and eating behavior to maintain or improve health. Outcome: Completed/Met Date Met:  09/21/15 Nutrition Education Note  RD consulted for nutrition education regarding a low fat diet. S/p Aborted laparoscopic and subsequent Open cholecystectomy with LOA   RD provided "Gallbladder Nutrition Therapy" handout from the Academy of [employer].  Provided examples on ways to decrease fat intake in diet. Discouraged intake of processed and fried foods. Encouraged fresh fruits and vegetables as well as whole grain sources of carbohydrates to maximize fiber intake. Teach back method used.  Expect fair compliance. Reviewed gas producing foods.   Body mass index is 31.49 kg/(m^2). Pt meets criteria for obesity based on current BMI.  Current diet order is clear liquid. Labs and medications reviewed. No further nutrition interventions warranted at this time. If additional nutrition issues arise, please re-consult RD.  Clayton Bibles, MS, RD, LDN Pager: [phone] After Hours Pager: [phone]

## 2015-09-21 NOTE — Progress Notes (Signed)
[facility] Progress Note  1 Day Post-Op  Subjective: Pt anxious, but doing okay.  Concerned about his inability to urinate except for about 119mL each time.  Had to be I&O cathed last night.  No N/V, pain well controlled.  Ambulated OOB to the bathroom and through the halls.  Abdomen feels distended.    Objective: Vital signs in last 24 hours: Temp:  [97.5 F (36.4 C)-98.6 F (37 C)] 98.2 F (36.8 C) (01/31 0550) Pulse Rate:  [65-98] 68 (01/31 0550) Resp:  [16-23] 18 (01/31 0550) BP: (101-128)/(65-76) 116/66 mmHg (01/31 0550) SpO2:  [92 %-98 %] 98 % (01/31 0550) Last BM Date: 09/18/15  Intake/Output from previous day: 01/30 0701 - 01/31 0700 In: 4620 [P.O.:120; I.V.:4500] Out: 1375 [Urine:1375] Intake/Output this shift:    PE: Gen:  Alert, NAD, pleasant Abd: Soft, distended, tender over incision sites, +BS, no HSM, incisions C/D/I with dry dressing over incision sites   Lab Results:   Recent Labs  09/19/15 0510  WBC 6.7  HGB 12.6*  HCT 38.3*  PLT 263   BMET  Recent Labs  09/20/15 0530 09/21/15 0505  NA 138 140  K 4.4 4.3  CL 107 107  CO2 22 23  GLUCOSE 152* 123*  BUN 29* 31*  CREATININE 1.42* 1.54*  CALCIUM 8.5* 8.7*   PT/INR No results for input(s): LABPROT, INR in the last 72 hours. CMP     Component Value Date/Time   NA 140 09/21/2015 0505   K 4.3 09/21/2015 0505   CL 107 09/21/2015 0505   CO2 23 09/21/2015 0505   GLUCOSE 123* 09/21/2015 0505   BUN 31* 09/21/2015 0505   CREATININE 1.54* 09/21/2015 0505   CALCIUM 8.7* 09/21/2015 0505   PROT 5.5* 09/21/2015 0505   ALBUMIN 2.5* 09/21/2015 0505   AST 80* 09/21/2015 0505   ALT 117* 09/21/2015 0505   ALKPHOS 459* 09/21/2015 0505   BILITOT 2.7* 09/21/2015 0505   GFRNONAA 43* 09/21/2015 0505   GFRAA 50* 09/21/2015 0505   Lipase  No results found for: LIPASE     Studies/Results: Dg Ercp Biliary & Pancreatic Ducts  09/19/2015  CLINICAL DATA:  Common bile duct dilatation and  known gallstones EXAM: ERCP TECHNIQUE: Multiple spot images obtained with the fluoroscopic device and submitted for interpretation post-procedure. FLUOROSCOPY TIME:  Radiation Exposure Index (as provided by the fluoroscopic device): 36.88 mGy If the device does not provide the exposure index: Fluoroscopy Time:  2 minutes 30 seconds Number of Acquired Images:  11 COMPARISON:  09/18/2015 FINDINGS: Initial images demonstrate a wire extending into the common bile duct. Dilatation of the common bile duct is noted. A sphincterotomy was performed and the balloon sweep of the duct was performed. By history this produced a common bile duct stone. IMPRESSION: Common bile duct dilatation secondary to distal common bile duct stone. This was removed during the exam. These images were submitted for radiologic interpretation only. Please see the procedural report for the amount of contrast and the fluoroscopy time utilized. Electronically Signed   By: Inez Catalina M.D.   On: 09/19/2015 09:26    Anti-infectives: Anti-infectives    Start     Dose/Rate Route Frequency Ordered Stop   09/20/15 1030  cefTRIAXone (ROCEPHIN) 2 g in dextrose 5 % 50 mL IVPB    Comments:  Pharmacy may adjust dosing strength, interval, or rate of medication as needed for optimal therapy for the patient Send with patient on call to the OR.  Anesthesia to complete  antibiotic administration <55min prior to incision per [facility].   2 g 100 mL/hr over 30 Minutes Intravenous On call to O.R. 09/20/15 1026 09/21/15 0559   09/20/15 1000  amoxicillin-clavulanate (AUGMENTIN) 875-125 MG per tablet 1 tablet     1 tablet Oral Every 12 hours 09/20/15 0839 09/25/15 0959   09/20/15 0000  amoxicillin-clavulanate (AUGMENTIN) 875-125 MG tablet     1 tablet Oral Every 12 hours 09/20/15 0952     09/18/15 1400  piperacillin-tazobactam (ZOSYN) IVPB 3.375 g  Status:  Discontinued     3.375 g 12.5 mL/hr over 240 Minutes Intravenous 3 times per day 09/18/15 0914  09/20/15 0839   09/18/15 0715  piperacillin-tazobactam (ZOSYN) IVPB 3.375 g     3.375 g 100 mL/hr over 30 Minutes Intravenous  Once 09/18/15 [id number] 09/18/15 0831       Assessment/Plan Cholangitis s/p ERCP with sphincterotomy and stone removal Choledocholithiasis, Cholelithiasis POD #1 s/p Aborted laparoscopic and subsequent Open cholecystectomy with LOA -LFT's continue to trend down -Creatinine up a bit -IVF, pain control, antiemetics -Continue antibiotics per GI for cholangitis, was on zosyn now on augmentin -Await for bowel function, continue clears -Continue inpatient until pain better controlled and medically stable -Repeat labs in am  Urinary retention -Resume Avadart, will place catheter, try voiding trial tomorrow    LOS: 3 days    Nat Christen 09/21/2015, 8:15 AM Pager: [phone]

## 2015-09-21 NOTE — Progress Notes (Signed)
Progress Note   Gary Roach [id number] DOB: 05/09/1941 DOA: 10-06-15 PCP: Sheela Stack, MD   Brief Narrative:   Gary Roach is an 75 y.o. male with a PMH of hypertension and diabetes, prior nephrolithiasis who was admitted 10/06/2015 with a chief complaint of the sudden onset of right flank pain accompanied by nausea and vomiting.  Assessment/Plan:   Principal Problem:  Choledocholithiasis with cholangitis with obstruction - Continue Augmentin. - S/P ERCP with sphincterotomy & CBD stone removal 09/19/15 and cholecystectomy 09/20/15. - LFTs trending down.    Duodenitis - PPI x 2 months.  No NSAIDS/ASA. - F/U with Dr. Earlean Shawl.  Active Problems:   Acute urinary retention - I&O cath PRN.  On Avodart.   Hypertension - Holding lisinopril given bump in creatinine.   Nephrolithiasis - Continue Avodart.   Hyperlipidemia associated with type 2 diabetes mellitus (HCC) - Continue Crestor.   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 Creatinine slightly higher today. Continue IV fluids. - Continue SSI, moderate scale Q AC/HS. - CBGs 123-159.    DVT prophylaxis - Lovenox ordered.   Family Communication/Anticipated D/C date and plan/Code Status   Family Communication: No family at the bedside. Disposition Plan: Home when LFTs improved and gallstone extracted. Anticipated D/C date:   09/20/15 if LFTs improved & cleared for D/C by GI. Code Status:  Full code.   IV Access:    Peripheral IV   Procedures and diagnostic studies:   Ct Renal Stone Study  Oct 06, 2015  CLINICAL DATA:  Right flank pain, nausea, and vomiting with sudden onset last night. History of kidney stones. EXAM: CT ABDOMEN AND PELVIS WITHOUT CONTRAST TECHNIQUE: Multidetector CT imaging of the abdomen and pelvis was performed following the standard protocol without IV contrast. COMPARISON:  06/28/2009 FINDINGS:  Atelectasis in the lung bases. 15 mm stone in the lower pole right kidney. No hydronephrosis or hydroureter on either side. No ureteral stones or bladder stones identified. Bladder wall is not thickened. There is a stone in the distal common bile duct with prominent dilatation of the intra and extrahepatic bile ducts. Several gallstones are also present. No obvious gallbladder wall thickening. Fatty infiltration of the pancreas. The spleen, adrenal glands, abdominal aorta, inferior vena cava, and retroperitoneal lymph nodes are unremarkable. Small esophageal hiatal hernia. Stomach, small bowel, and colon are not abnormally distended. Scattered diverticula in the colon. No free air or free fluid in the abdomen. Abdominal wall musculature appears intact. Prominent visceral adipose tissues. Pelvis: Fat in the inguinal canals bilaterally. The appendix is not identified. Scattered diverticula in the sigmoid colon without evidence of diverticulitis. Prostate gland is enlarged, measuring 5.1 cm diameter. Prostatic impression upon the bladder base. Bladder wall is not thickened. The no free or loculated pelvic fluid collections. Degenerative changes in the spine. No destructive bone lesions. IMPRESSION: Nonobstructing stone in the lower pole right kidney. There is an obstructing stone in the distal common bile duct with proximal intra and extrahepatic bile duct dilatation. Cholelithiasis. Enlarged prostate gland. Electronically Signed   By: Lucienne Capers M.D.   On: 10/06/15 02:02   US Abdomen Limited Ruq  10/06/15  CLINICAL DATA:  Obstructing stone at the distal common bile duct on CT. Further evaluation requested. Initial encounter. EXAM: US ABDOMEN LIMITED - RIGHT UPPER QUADRANT COMPARISON:  CT of the abdomen and pelvis performed earlier today at 1:51 a.m. FINDINGS: Gallbladder: Stones are again noted within the gallbladder, measuring up  to 2.2 cm in size. No definite gallbladder wall thickening or  pericholecystic fluid is seen. No ultrasonographic Murphy's sign is elicited. Common bile duct: Diameter: 1.1 cm; dilated, as noted on CT, reflecting the distal obstructing stone. The known obstructing stone is not seen on this study due to overlying bowel gas. Liver: No focal lesion identified. Intrahepatic biliary duct dilatation is better characterized on recent CT. Within normal limits in parenchymal echogenicity. IMPRESSION: 1. Dilatation of the common bile duct and intrahepatic biliary ductal dilatation, reflecting the obstructing stone at the distal common bile duct as seen on CT. The known obstructing stone is not characterized on this study due to overlying bowel gas. 2. Underlying cholelithiasis.  No evidence for cholecystitis. Electronically Signed   By: Garald Balding M.D.   On: 09/18/2015 06:36     Medical Consultants:    Gastroenterology: Ronald Lobo, MD  Surgery: Autumn Messing III, MD  Anti-Infectives:   Lajean Silvius 09/18/15--->09/20/15 Augmentin 09/20/15--->   Subjective:   Gary Roach reports 4/10 abdominal pain.  No flatus yet.  Had urinary retention this a.m., I&O cathed and a liter of fluid was drained.    Objective:    Filed Vitals:   09/20/15 1941 09/20/15 2059 09/21/15 0135 09/21/15 0550  BP: 122/66 101/70 107/67 116/66  Pulse: 82 76 65 68  Temp: 97.8 F (36.6 C) 97.8 F (36.6 C) 98.6 F (37 C) 98.2 F (36.8 C)  TempSrc: Axillary Axillary Oral Oral  Resp: 18 18 16 18   Height:      Weight:      SpO2: 96% 97% 96% 98%    Intake/Output Summary (Last 24 hours) at 09/21/15 0724 Last data filed at 09/21/15 0600  Gross per 24 hour  Intake   4620 ml  Output   1375 ml  Net   3245 ml   Filed Weights   09/18/15 0855  Weight: 105.325 kg (232 lb 3.2 oz)    Exam: Gen:  NAD Cardiovascular:  RRR, No M/R/G Respiratory:  Lungs CTAB Gastrointestinal:  Abdomen tender at operative site, +BS Extremities:  No C/E/C   Data Reviewed:    Labs: Basic Metabolic  Panel:  Recent Labs Lab 09/18/15 0342 09/19/15 0510 09/20/15 0530 09/21/15 0505  NA 139 138 138 140  K 4.4 3.9 4.4 4.3  CL 105 107 107 107  CO2 22 23 22 23   GLUCOSE 142* 117* 152* 123*  BUN 33* 21* 29* 31*  CREATININE 1.33* 1.08 1.42* 1.54*  CALCIUM 9.1 8.6* 8.5* 8.7*   GFR Estimated Creatinine Clearance: 52.8 mL/min (by C-G formula based on Cr of 1.54). Liver Function Tests:  Recent Labs Lab 09/18/15 0342 09/19/15 0510 09/20/15 0530 09/21/15 0505  AST 116* 116* 86* 80*  ALT 186* 154* 131* 117*  ALKPHOS 617* 533* 490* 459*  BILITOT 5.5* 6.2* 3.7* 2.7*  PROT 6.2* 5.7* 5.4* 5.5*  ALBUMIN 3.0* 2.6* 2.3* 2.5*   CBC:  Recent Labs Lab 09/18/15 0342 09/19/15 0510  WBC 17.4* 6.7  NEUTROABS 15.3*  --   HGB 13.3 12.6*  HCT 40.4 38.3*  MCV 94.0 94.3  PLT 272 263   CBG:  Recent Labs Lab 09/20/15 0406 09/20/15 0812 09/20/15 1210 09/20/15 1549 09/20/15 2129  GLUCAP 147* 123* 118* 148* 159*   Microbiology Recent Results (from the past 240 hour(s))  Surgical pcr screen     Status: None   Collection Time: 09/20/15 10:51 AM  Result Value Ref Range Status   MRSA, PCR NEGATIVE NEGATIVE Final  Staphylococcus aureus NEGATIVE NEGATIVE Final    Comment:        The Xpert SA Assay (FDA approved for NASAL specimens in patients over 22 years of age), is one component of a comprehensive surveillance program.  Test performance has been validated by [facility] for patients greater than or equal to 81 year old. It is not intended to diagnose infection nor to guide or monitor treatment.      Medications:   . amoxicillin-clavulanate  1 tablet Oral Q12H  . antiseptic oral rinse  7 mL Mouth Rinse q12n4p  . chlorhexidine  15 mL Mouth Rinse BID  . dutasteride  0.5 mg Oral Daily  . indomethacin  100 mg Rectal Once  . insulin aspart  0-15 Units Subcutaneous TID WC  . insulin aspart  0-5 Units Subcutaneous QHS  . lisinopril  40 mg Oral Daily  . pantoprazole  40 mg  Oral BID  . rosuvastatin  10 mg Oral q1800   Continuous Infusions: . sodium chloride 100 mL/hr at 09/21/15 0440    Time spent: 25 minutes.   LOS: 3 days   [facility] Pager [phone]. If unable to reach me by pager, please call my cell phone at [phone].  *Please refer to [email or web address], password [email or web address] to get updated schedule on who will round on this patient, as hospitalists switch teams weekly. If 7PM-7AM, please contact night-coverage at [email or web address], password [email or web address] for any overnight needs.  09/21/2015, 7:24 AM

## 2015-09-22 LAB — COMPREHENSIVE METABOLIC PANEL
ALBUMIN: 2.4 g/dL — AB (ref 3.5–5.0)
ALT: 101 U/L — ABNORMAL HIGH (ref 17–63)
ANION GAP: 7 (ref 5–15)
AST: 80 U/L — ABNORMAL HIGH (ref 15–41)
Alkaline Phosphatase: 407 U/L — ABNORMAL HIGH (ref 38–126)
BUN: 19 mg/dL (ref 6–20)
CHLORIDE: 107 mmol/L (ref 101–111)
CO2: 25 mmol/L (ref 22–32)
Calcium: 8.7 mg/dL — ABNORMAL LOW (ref 8.9–10.3)
Creatinine, Ser: 1.22 mg/dL (ref 0.61–1.24)
GFR calc Af Amer: >60 mL/min (ref >60)
GFR calc non Af Amer: 57 mL/min — ABNORMAL LOW (ref >60)
GLUCOSE: 102 mg/dL — AB (ref 65–99)
POTASSIUM: 3.8 mmol/L (ref 3.5–5.1)
SODIUM: 139 mmol/L (ref 135–145)
TOTAL PROTEIN: 5.5 g/dL — AB (ref 6.5–8.1)
Total Bilirubin: 2.6 mg/dL — ABNORMAL HIGH (ref 0.3–1.2)

## 2015-09-22 LAB — CBC
HCT: 35.4 % — ABNORMAL LOW (ref 39.0–52.0)
Hemoglobin: 11.7 g/dL — ABNORMAL LOW (ref 13.0–17.0)
MCH: 31 pg (ref 26.0–34.0)
MCHC: 33.1 g/dL (ref 30.0–36.0)
MCV: 93.9 fL (ref 78.0–100.0)
PLATELETS: 243 10*3/uL (ref 150–400)
RBC: 3.77 MIL/uL — ABNORMAL LOW (ref 4.22–5.81)
RDW: 15.3 % (ref 11.5–15.5)
WBC: 8 10*3/uL (ref 4.0–10.5)

## 2015-09-22 LAB — GLUCOSE, CAPILLARY
GLUCOSE-CAPILLARY: 91 mg/dL (ref 65–99)
GLUCOSE-CAPILLARY: 111 mg/dL — AB (ref 65–99)
Glucose-Capillary: 95 mg/dL (ref 65–99)
Glucose-Capillary: 106 mg/dL — ABNORMAL HIGH (ref 65–99)

## 2015-09-22 MED ORDER — TRAMADOL HCL 50 MG PO TABS
50.0000 mg | ORAL_TABLET | Freq: Four times a day (QID) | ORAL | Status: DC | PRN
  Administered 2015-09-22 (×2): 50 mg via ORAL
  Administered 2015-09-23: 100 mg via ORAL
  Administered 2015-09-23: 50 mg via ORAL
  Filled 2015-09-22: qty 2
  Filled 2015-09-22: qty 1
  Filled 2015-09-22: qty 2
  Filled 2015-09-22 (×2): qty 1

## 2015-09-22 NOTE — Progress Notes (Signed)
[facility] Progress Note  2 Days Post-Op  Subjective: Pt anxious.  Says he's passed a little flatus.  Less distension, doesn't want to take the pain meds would like an alternative to IV and oxy.  No N/V, tolerating clears well.  Says he could eat something more.  Ambulating in halls.  Foley in place.  Objective: Vital signs in last 24 hours: Temp:  [98 F (36.7 C)-98.9 F (37.2 C)] 98.9 F (37.2 C) (02/01 0601) Pulse Rate:  [62-82] 62 (02/01 0601) Resp:  [18-20] 18 (02/01 0601) BP: (110-128)/(62-64) 122/63 mmHg (02/01 0601) SpO2:  [95 %-97 %] 95 % (02/01 0601) Last BM Date: 09/18/15  Intake/Output from previous day: 01/31 0701 - 02/01 0700 In: 1200 [I.V.:1200] Out: 3675 [Urine:3675] Intake/Output this shift: Total I/O In: -  Out: 350 [Urine:350]  PE: Gen:  Alert, NAD, anxious but pleasant Abd: Soft, mild distension (less than yesterday), tender over incision sites, +BS, no HSM, incisions C/D/I, drain with minimal sanguinous drainage, no abdominal scars noted   Lab Results:   Recent Labs  09/22/15 0509  WBC 8.0  HGB 11.7*  HCT 35.4*  PLT 243   BMET  Recent Labs  09/21/15 0505 09/22/15 0509  NA 140 139  K 4.3 3.8  CL 107 107  CO2 23 25  GLUCOSE 123* 102*  BUN 31* 19  CREATININE 1.54* 1.22  CALCIUM 8.7* 8.7*   PT/INR No results for input(s): LABPROT, INR in the last 72 hours. CMP     Component Value Date/Time   NA 139 09/22/2015 0509   K 3.8 09/22/2015 0509   CL 107 09/22/2015 0509   CO2 25 09/22/2015 0509   GLUCOSE 102* 09/22/2015 0509   BUN 19 09/22/2015 0509   CREATININE 1.22 09/22/2015 0509   CALCIUM 8.7* 09/22/2015 0509   PROT 5.5* 09/22/2015 0509   ALBUMIN 2.4* 09/22/2015 0509   AST 80* 09/22/2015 0509   ALT 101* 09/22/2015 0509   ALKPHOS 407* 09/22/2015 0509   BILITOT 2.6* 09/22/2015 0509   GFRNONAA 57* 09/22/2015 0509   GFRAA >60 09/22/2015 0509   Lipase  No results found for: LIPASE     Studies/Results: No  results found.  Anti-infectives: Anti-infectives    Start     Dose/Rate Route Frequency Ordered Stop   09/20/15 1030  cefTRIAXone (ROCEPHIN) 2 g in dextrose 5 % 50 mL IVPB  Status:  Discontinued    Comments:  Pharmacy may adjust dosing strength, interval, or rate of medication as needed for optimal therapy for the patient Send with patient on call to the OR.  Anesthesia to complete antibiotic administration <48min prior to incision per [facility].   2 g 100 mL/hr over 30 Minutes Intravenous On call to O.R. 09/20/15 1026 09/21/15 0820   09/20/15 1000  amoxicillin-clavulanate (AUGMENTIN) 875-125 MG per tablet 1 tablet     1 tablet Oral Every 12 hours 09/20/15 0839 09/25/15 0959   09/20/15 0000  amoxicillin-clavulanate (AUGMENTIN) 875-125 MG tablet     1 tablet Oral Every 12 hours 09/20/15 0952     09/18/15 1400  piperacillin-tazobactam (ZOSYN) IVPB 3.375 g  Status:  Discontinued     3.375 g 12.5 mL/hr over 240 Minutes Intravenous 3 times per day 09/18/15 0914 09/20/15 0839   09/18/15 0715  piperacillin-tazobactam (ZOSYN) IVPB 3.375 g     3.375 g 100 mL/hr over 30 Minutes Intravenous  Once 09/18/15 [id number] 09/18/15 0831       Assessment/Plan Cholangitis s/p ERCP with sphincterotomy  and stone removal Choledocholithiasis, Cholelithiasis POD #2 s/p Aborted laparoscopic and subsequent Open cholecystectomy with LOA -LFT's continue to trend down -Creatinine improved -IVF, pain control, antiemetics -Continue antibiotics per GI for cholangitis, was on zosyn now on augmentin -Await for bowel function, advance to soft diet -Continue inpatient until pain better controlled and medically stable -Repeat cmet in am -Remove dressings tomorrow, staples out POD #10-14 days  Urinary retention -Avadart, d/c foley, try voiding trial today    LOS: 4 days    Nat Christen 09/22/2015, 8:29 AM Pager: [phone]

## 2015-09-22 NOTE — Progress Notes (Signed)
[facility] is providing the following services: Rolling walker  If patient discharges after hours, please call [phone].   Linward Headland 09/22/2015, 1:54 PM

## 2015-09-22 NOTE — Care Management Note (Signed)
Case Management Note  Patient Details  Name: Gary Roach MRN: [id number] Date of Birth: October 18, 1940  Subjective/Objective:     Aborted lap and subsequent Open cholecystectomy with lysis of adhesions               Action/Plan: Discharge planning, spoke with patient at bedside. 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 Discussed d/c plans, patient understands he will need assistance for several days post d/c. He says his daughter is not able to come until Friday and he has some friends he will call on to bridge the gap. He is interested in a walker. He is concerned about voiding, he has had a catheter at home with a leg bag when he had kidney stones in the past. He is eager to d/c but understands limitations and need for some assistance.   Expected Discharge Date:  09/20/15               Expected Discharge Plan:  Home/Self Care  In-House Referral:  NA  Discharge planning Services  CM Consult  Post Acute Care Choice:  Durable Medical Equipment Choice offered to:  Patient  DME Arranged:  [facility] rolling DME Agency:  [place]:  NA [place] Agency:  NA  Status of Service:  Completed, signed off  Medicare Important Message Given:  Yes Date Medicare IM Given:    Medicare IM give by:    Date Additional Medicare IM Given:    Additional Medicare Important Message give by:     If discussed at [place] of Stay Meetings, dates discussed:    Additional Comments:  Guadalupe Maple, RN 09/22/2015, 12:28 PM

## 2015-09-22 NOTE — Progress Notes (Signed)
Progress Note   Gary Roach [id number] DOB: July 30, 1941 DOA: 07-Oct-2015 PCP: Sheela Stack, MD   Brief Narrative:   Gary Roach is an 75 y.o. male with a PMH of hypertension and diabetes, prior nephrolithiasis who was admitted 10/07/2015 with a chief complaint of the sudden onset of right flank pain accompanied by nausea and vomiting.  Assessment/Plan:   Principal Problem:  Choledocholithiasis with cholangitis with obstruction - Continue Augmentin. - S/P ERCP with sphincterotomy & CBD stone removal 09/19/15 and cholecystectomy 09/20/15. - LFTs trending down.    Duodenitis - PPI x 2 months.  No NSAIDS/ASA. - F/U with Dr. Earlean Shawl.  Active Problems:   Acute urinary retention - Foley to be taken out today.  On Avodart.   Hypertension - Creatinine improved, lisinopril remains on hold.   Nephrolithiasis - Continue Avodart.   Hyperlipidemia associated with type 2 diabetes mellitus (HCC) - Continue Crestor.   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 Creatinine slightly higher today. Continue IV fluids. - Continue SSI, moderate scale Q AC/HS. - CBGs 95-122.    DVT prophylaxis - Lovenox ordered.   Family Communication/Anticipated D/C date and plan/Code Status   Family Communication: No family at the bedside. Disposition Plan: Home when LFTs improved and diet advanced, urinary retention resolved. Anticipated D/C date:   09/23/15 if above goals met. Code Status:  Full code.   IV Access:    Peripheral IV   Procedures and diagnostic studies:   Ct Renal Stone Study  10/07/15  CLINICAL DATA:  Right flank pain, nausea, and vomiting with sudden onset last night. History of kidney stones. EXAM: CT ABDOMEN AND PELVIS WITHOUT CONTRAST TECHNIQUE: Multidetector CT imaging of the abdomen and pelvis was performed following the standard protocol without IV contrast. COMPARISON:   06/28/2009 FINDINGS: Atelectasis in the lung bases. 15 mm stone in the lower pole right kidney. No hydronephrosis or hydroureter on either side. No ureteral stones or bladder stones identified. Bladder wall is not thickened. There is a stone in the distal common bile duct with prominent dilatation of the intra and extrahepatic bile ducts. Several gallstones are also present. No obvious gallbladder wall thickening. Fatty infiltration of the pancreas. The spleen, adrenal glands, abdominal aorta, inferior vena cava, and retroperitoneal lymph nodes are unremarkable. Small esophageal hiatal hernia. Stomach, small bowel, and colon are not abnormally distended. Scattered diverticula in the colon. No free air or free fluid in the abdomen. Abdominal wall musculature appears intact. Prominent visceral adipose tissues. Pelvis: Fat in the inguinal canals bilaterally. The appendix is not identified. Scattered diverticula in the sigmoid colon without evidence of diverticulitis. Prostate gland is enlarged, measuring 5.1 cm diameter. Prostatic impression upon the bladder base. Bladder wall is not thickened. The no free or loculated pelvic fluid collections. Degenerative changes in the spine. No destructive bone lesions. IMPRESSION: Nonobstructing stone in the lower pole right kidney. There is an obstructing stone in the distal common bile duct with proximal intra and extrahepatic bile duct dilatation. Cholelithiasis. Enlarged prostate gland. Electronically Signed   By: Lucienne Capers M.D.   On: 10/07/15 02:02   US Abdomen Limited Ruq  10/07/2015  CLINICAL DATA:  Obstructing stone at the distal common bile duct on CT. Further evaluation requested. Initial encounter. EXAM: US ABDOMEN LIMITED - RIGHT UPPER QUADRANT COMPARISON:  CT of the abdomen and pelvis performed earlier today at 1:51 a.m. FINDINGS: Gallbladder: Stones are again noted within the gallbladder, measuring  up to 2.2 cm in size. No definite gallbladder wall  thickening or pericholecystic fluid is seen. No ultrasonographic Murphy's sign is elicited. Common bile duct: Diameter: 1.1 cm; dilated, as noted on CT, reflecting the distal obstructing stone. The known obstructing stone is not seen on this study due to overlying bowel gas. Liver: No focal lesion identified. Intrahepatic biliary duct dilatation is better characterized on recent CT. Within normal limits in parenchymal echogenicity. IMPRESSION: 1. Dilatation of the common bile duct and intrahepatic biliary ductal dilatation, reflecting the obstructing stone at the distal common bile duct as seen on CT. The known obstructing stone is not characterized on this study due to overlying bowel gas. 2. Underlying cholelithiasis.  No evidence for cholecystitis. Electronically Signed   By: Garald Balding M.D.   On: 09/18/2015 06:36     Medical Consultants:    Gastroenterology: Ronald Lobo, MD  Surgery: Autumn Messing III, MD  Anti-Infectives:   Lajean Silvius 09/18/15--->09/20/15 Augmentin 09/20/15--->   Subjective:   Gary Roach reports mild abdominal pain.  No flatus yet. No solid foods yet.  No N/V. Has some upper airway congestion.  Objective:    Filed Vitals:   09/21/15 0550 09/21/15 1405 09/21/15 2129 09/22/15 0601  BP: 116/66 110/64 128/62 122/63  Pulse: 68 71 82 62  Temp: 98.2 F (36.8 C) 98.4 F (36.9 C) 98 F (36.7 C) 98.9 F (37.2 C)  TempSrc: Oral Oral Oral Oral  Resp: _0 Height:      Weight:      SpO2: 98% 97% 97% 95%    Intake/Output Summary (Last 24 hours) at 09/22/15 0856 Last data filed at 09/22/15 0749  Gross per 24 hour  Intake   1200 ml  Output   4025 ml  Net  -2825 ml   Filed Weights   09/18/15 0855  Weight: 105.325 kg (232 lb 3.2 oz)    Exam: Gen:  NAD Cardiovascular:  RRR, No M/R/G Respiratory:  Lungs CTAB Gastrointestinal:  Soft, NT/ND, +BS Extremities:  No C/E/C   Data Reviewed:    Labs: Basic Metabolic Panel:  Recent Labs Lab  09/18/15 0342 09/19/15 0510 09/20/15 0530 09/21/15 0505 09/22/15 0509  NA 139 138 138 140 139  K 4.4 3.9 4.4 4.3 3.8  CL 105 107 107 107 107  CO2 _1 GLUCOSE 142* 117* 152* 123* 102*  BUN 33* 21* 29* 31* 19  CREATININE 1.33* 1.08 1.42* 1.54* 1.22  CALCIUM 9.1 8.6* 8.5* 8.7* 8.7*   GFR Estimated Creatinine Clearance: 66.6 mL/min (by C-G formula based on Cr of 1.22). Liver Function Tests:  Recent Labs Lab 09/18/15 0342 09/19/15 0510 09/20/15 0530 09/21/15 0505 09/22/15 0509  AST 116* 116* 86* 80* 80*  ALT 186* 154* 131* 117* 101*  ALKPHOS 617* 533* 490* 459* 407*  BILITOT 5.5* 6.2* 3.7* 2.7* 2.6*  PROT 6.2* 5.7* 5.4* 5.5* 5.5*  ALBUMIN 3.0* 2.6* 2.3* 2.5* 2.4*   CBC:  Recent Labs Lab 09/18/15 0342 09/19/15 0510 09/22/15 0509  WBC 17.4* 6.7 8.0  NEUTROABS 15.3*  --   --   HGB 13.3 12.6* 11.7*  HCT 40.4 38.3* 35.4*  MCV 94.0 94.3 93.9  PLT 272 263 243   CBG:  Recent Labs Lab 09/21/15 0743 09/21/15 1204 09/21/15 1707 09/21/15 2124 09/22/15 0800  GLUCAP 101* 122* 116* 108* 95   Microbiology Recent Results (from the past 240 hour(s))  Surgical pcr screen     Status: None  Collection Time: 09/20/15 10:51 AM  Result Value Ref Range Status   MRSA, PCR NEGATIVE NEGATIVE Final   Staphylococcus aureus NEGATIVE NEGATIVE Final    Comment:        The Xpert SA Assay (FDA approved for NASAL specimens in patients over 46 years of age), is one component of a comprehensive surveillance program.  Test performance has been validated by [facility] for patients greater than or equal to 83 year old. It is not intended to diagnose infection nor to guide or monitor treatment.      Medications:   . amoxicillin-clavulanate  1 tablet Oral Q12H  . antiseptic oral rinse  7 mL Mouth Rinse q12n4p  . chlorhexidine  15 mL Mouth Rinse BID  . dutasteride  0.5 mg Oral Daily  . enoxaparin (LOVENOX) injection  40 mg Subcutaneous Q24H  . indomethacin  100 mg  Rectal Once  . insulin aspart  0-15 Units Subcutaneous TID WC  . insulin aspart  0-5 Units Subcutaneous QHS  . lisinopril  40 mg Oral Daily  . pantoprazole  40 mg Oral BID  . rosuvastatin  10 mg Oral q1800   Continuous Infusions: . sodium chloride 100 mL/hr at 09/22/15 0012    Time spent: 25 minutes.   LOS: 4 days   [place] [facility] Pager [phone]. If unable to reach me by pager, please call my cell phone at [phone].  *Please refer to [email or web address], password [email or web address] to get updated schedule on who will round on this patient, as hospitalists switch teams weekly. If 7PM-7AM, please contact night-coverage at [email or web address], password [email or web address] for any overnight needs.  09/22/2015, 8:56 AM

## 2015-09-23 DIAGNOSIS — I10 Essential (primary) hypertension: Secondary | ICD-10-CM

## 2015-09-23 DIAGNOSIS — R339 Retention of urine, unspecified: Secondary | ICD-10-CM

## 2015-09-23 DIAGNOSIS — N179 Acute kidney failure, unspecified: Secondary | ICD-10-CM

## 2015-09-23 LAB — COMPREHENSIVE METABOLIC PANEL
ALBUMIN: 2.4 g/dL — AB (ref 3.5–5.0)
ALK PHOS: 372 U/L — AB (ref 38–126)
ALT: 121 U/L — ABNORMAL HIGH (ref 17–63)
ANION GAP: 8 (ref 5–15)
AST: 94 U/L — ABNORMAL HIGH (ref 15–41)
BILIRUBIN TOTAL: 3.2 mg/dL — AB (ref 0.3–1.2)
BUN: 17 mg/dL (ref 6–20)
CALCIUM: 8.3 mg/dL — AB (ref 8.9–10.3)
CO2: 24 mmol/L (ref 22–32)
Chloride: 106 mmol/L (ref 101–111)
Creatinine, Ser: 1.19 mg/dL (ref 0.61–1.24)
GFR calc non Af Amer: 58 mL/min — ABNORMAL LOW (ref >60)
Glucose, Bld: 124 mg/dL — ABNORMAL HIGH (ref 65–99)
POTASSIUM: 3.5 mmol/L (ref 3.5–5.1)
SODIUM: 138 mmol/L (ref 135–145)
TOTAL PROTEIN: 5.5 g/dL — AB (ref 6.5–8.1)

## 2015-09-23 LAB — GLUCOSE, CAPILLARY
GLUCOSE-CAPILLARY: 106 mg/dL — AB (ref 65–99)
Glucose-Capillary: 118 mg/dL — ABNORMAL HIGH (ref 65–99)
Glucose-Capillary: 107 mg/dL — ABNORMAL HIGH (ref 65–99)
Glucose-Capillary: 136 mg/dL — ABNORMAL HIGH (ref 65–99)

## 2015-09-23 NOTE — Progress Notes (Signed)
[facility] Progress Note  3 Days Post-Op  Subjective: Pt feels good, much less pain.  No N/V, tolerating soft diet well.  Ambulating in halls.  Urinating on own since foley discontinued.  No bladder distension discomfort like before.  Passing good flatus, no BM yet.  Objective: Vital signs in last 24 hours: Temp:  [98 F (36.7 C)-98.8 F (37.1 C)] 98 F (36.7 C) (02/02 0530) Pulse Rate:  [62-77] 62 (02/02 0530) Resp:  [18-19] 18 (02/02 0530) BP: (120-136)/(66-75) 136/75 mmHg (02/02 0530) SpO2:  [94 %-95 %] 95 % (02/02 0530) Last BM Date: 09/18/15  Intake/Output from previous day: 02/01 0701 - 02/02 0700 In: 2430 [P.O.:30; I.V.:2400] Out: 1300 [Urine:1300] Intake/Output this shift: Total I/O In: -  Out: 150 [Urine:150]  PE: Gen:  Alert, NAD, pleasant Abd: Soft, mild distension, tender over incision sites, +BS, no HSM, incisions C/D/I, dressings removed without drainage and staples in place   Lab Results:   Recent Labs  09/22/15 0509  WBC 8.0  HGB 11.7*  HCT 35.4*  PLT 243   BMET  Recent Labs  09/22/15 0509 09/23/15 0432  NA 139 138  K 3.8 3.5  CL 107 106  CO2 25 24  GLUCOSE 102* 124*  BUN 19 17  CREATININE 1.22 1.19  CALCIUM 8.7* 8.3*   PT/INR No results for input(s): LABPROT, INR in the last 72 hours. CMP     Component Value Date/Time   NA 138 09/23/2015 0432   K 3.5 09/23/2015 0432   CL 106 09/23/2015 0432   CO2 24 09/23/2015 0432   GLUCOSE 124* 09/23/2015 0432   BUN 17 09/23/2015 0432   CREATININE 1.19 09/23/2015 0432   CALCIUM 8.3* 09/23/2015 0432   PROT 5.5* 09/23/2015 0432   ALBUMIN 2.4* 09/23/2015 0432   AST 94* 09/23/2015 0432   ALT 121* 09/23/2015 0432   ALKPHOS 372* 09/23/2015 0432   BILITOT 3.2* 09/23/2015 0432   GFRNONAA 58* 09/23/2015 0432   GFRAA >60 09/23/2015 0432   Lipase  No results found for: LIPASE     Studies/Results: No results found.  Anti-infectives: Anti-infectives    Start     Dose/Rate  Route Frequency Ordered Stop   09/20/15 1030  cefTRIAXone (ROCEPHIN) 2 g in dextrose 5 % 50 mL IVPB  Status:  Discontinued    Comments:  Pharmacy may adjust dosing strength, interval, or rate of medication as needed for optimal therapy for the patient Send with patient on call to the OR.  Anest[facility] to complete antibiotic administration <63mn [facility]   2 g 100 mL/hr over 30 Minutes Intravenous On call to O.R. 09/20/15 1026 09/21/15 0820   09/20/15 1000  amoxicillin-clavulanate (AUGMENTIN) 875-125 MG per tablet 1 tablet     1 tablet Oral Every 12 hours 09/20/15 0839 09/25/15 0959   09/20/15 0000  amoxicillin-clavulanate (AUGMENTIN) 875-125 MG tablet     1 tablet Oral Every 12 hours 09/20/15 0952     09/18/15 1400  piperacillin-tazobactam (ZOSYN) IVPB 3.375 g  Status:  Discontinued     3.375 g 12.5 mL/hr over 240 Minutes Intravenous 3 times per day 09/18/15 0914 09/20/15 0839   09/18/15 0715  piperacillin-tazobactam (ZOSYN) IVPB 3.375 g     3.375 g 100 mL/hr over 30 Minutes Intravenous  Once 09/18/15 0601001/28/17 0831       Assessment/Plan Cholangitis s/p ERCP with sphincterotomy and stone removal Choledocholithiasis, Cholelithiasis POD #3 s/p Aborted laparoscopic and subsequent Open cholecystectomy with  LOA -ALT/AST slightly up and Bili up to 3.2 today, alk phos improved to 372, continue to monitor -IVF, pain control, antiemetics -Continue antibiotics per GI for cholangitis, was on zosyn now on augmentin -Tolerating soft diet -Repeat cmet in am -Remove dressings today, staples out POD #10-14 days -Plan for d/c tomorrow if LFT's improved and urinating better  Urinary retention -Avadart, voiding on own, but small more frequent amounts -Cr improving    LOS: 5 days    Nat Christen 09/23/2015, 8:51 AM Pager: [phone]

## 2015-09-23 NOTE — Progress Notes (Signed)
Patient ID: Gary Roach, male   DOB: 02/15/1941, 75 y.o.   MRN: [id number] [facility] PROGRESS NOTE  KILEN BOBLITT [id number] DOB: [birth year] DOA: Oct 13, 2015 PCP: Sheela Stack, MD  Brief narrative:    75 y.o. male with a PMH of hypertension and diabetes, prior nephrolithiasis who was admitted 13-Oct-2015 with a chief complaint of the sudden onset of right flank pain accompanied by nausea and vomiting.  Assessment/Plan:    Principal Problem:  Choledocholithiasis with cholangitis with obstruction - Continue Augmentin. - S/P ERCP with sphincterotomy & CBD stone removal 09/19/15 and cholecystectomy 09/20/15. - Continue to trend LFT's   Duodenitis - PPI x 2 months. No NSAIDS/ASA. - F/U with Dr. Earlean Shawl.  Active Problems:  Acute urinary retention - Continue Avodart.   Hypertension, essential - Hypotensive so lisinopril on hold    Nephrolithiasis - Continue Avodart.   Hyperlipidemia associated with type 2 diabetes mellitus (HCC) - Continue Crestor.   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  - Cr as high as 1.5 liekly due to acute illness, infection - Now WNL  DVT Prophylaxis  - Lovenox subQ ordered    Code Status: Full.  Family Communication:  plan of care discussed with the patient Disposition Plan: Home 2/3 if LFT's better   IV access:  Peripheral IV  Procedures and diagnostic studies:    Ct Renal Stone Study 10/13/15 Nonobstructing stone in the lower pole right kidney. There is an obstructing stone in the distal common bile duct with proximal intra and extrahepatic bile duct dilatation. Cholelithiasis. Enlarged prostate gland. Electronically Signed By: Lucienne Capers M.D. On: 13-Oct-2015 02:02   US Abdomen Limited Ruq 10/13/2015 1. Dilatation of the common bile duct and intrahepatic biliary ductal dilatation, reflecting the obstructing stone at the distal  common bile duct as seen on CT. The known obstructing stone is not characterized on this study due to overlying bowel gas. 2. Underlying cholelithiasis. No evidence for cholecystitis. Electronically Signed By: Garald Balding M.D. On: 2015/10/13 06:36    Medical Consultants:  Surgery GI   IAnti-Infectives:   Zosyn Oct 13, 2015--->09/20/15 Augmentin 09/20/15--->  Leisa Lenz, MD  [facility] Pager [phone]  Time spent in minutes: 25 minutes  If 7PM-7AM, please contact night-coverage [email or web address] Password [facility] 09/23/2015, 4:43 PM   LOS: 5 days    HPI/Subjective: No acute overnight events. Patient reports he feels better.  Objective: Filed Vitals:   09/22/15 2149 09/23/15 0530 09/23/15 0949 09/23/15 1500  BP: 128/66 136/75 115/68 88/58  Pulse: 68 62  84  Temp: 98.1 F (36.7 C) 98 F (36.7 C)  98.2 F (36.8 C)  TempSrc: Oral Oral  Axillary  Resp: 18 18  18   Height:      Weight:      SpO2: 95% 95%  96%    Intake/Output Summary (Last 24 hours) at 09/23/15 1643 Last data filed at 09/23/15 1501  Gross per 24 hour  Intake 2091.17 ml  Output    950 ml  Net 1141.17 ml    Exam:   General:  Pt is alert, follows commands appropriately, not in acute distress  Cardiovascular: Regular rate and rhythm, S1/S2, no murmurs  Respiratory: Clear to auscultation bilaterally, no wheezing, no crackles, no rhonchi  Abdomen: non distended, bowel sounds present  Extremities: No edema, pulses DP and PT palpable bilaterally  Neuro: Grossly nonfocal  Data Reviewed: Basic Metabolic Panel:  Recent Labs Lab 09/19/15 0510 09/20/15 0530 09/21/15 0505 09/22/15 [id number]  09/23/15 0432  NA 138 138 140 139 138  K 3.9 4.4 4.3 3.8 3.5  CL 107 107 107 107 106  CO2 23 22 23 25 24   GLUCOSE 117* 152* 123* 102* 124*  BUN 21* 29* 31* 19 17  CREATININE 1.08 1.42* 1.54* 1.22 1.19  CALCIUM 8.6* 8.5* 8.7* 8.7* 8.3*   Liver Function Tests:  Recent Labs Lab 09/19/15 0510  09/20/15 0530 09/21/15 0505 09/22/15 0509 09/23/15 0432  AST 116* 86* 80* 80* 94*  ALT 154* 131* 117* 101* 121*  ALKPHOS 533* 490* 459* 407* 372*  BILITOT 6.2* 3.7* 2.7* 2.6* 3.2*  PROT 5.7* 5.4* 5.5* 5.5* 5.5*  ALBUMIN 2.6* 2.3* 2.5* 2.4* 2.4*   No results for input(s): LIPASE, AMYLASE in the last 168 hours. No results for input(s): AMMONIA in the last 168 hours. CBC:  Recent Labs Lab 09/18/15 0342 09/19/15 0510 09/22/15 0509  WBC 17.4* 6.7 8.0  NEUTROABS 15.3*  --   --   HGB 13.3 12.6* 11.7*  HCT 40.4 38.3* 35.4*  MCV 94.0 94.3 93.9  PLT 272 263 243   Cardiac Enzymes: No results for input(s): CKTOTAL, CKMB, CKMBINDEX, TROPONINI in the last 168 hours. BNP: Invalid input(s): POCBNP CBG:  Recent Labs Lab 09/22/15 1200 09/22/15 1628 09/22/15 2148 09/23/15 0738 09/23/15 1213  GLUCAP 106* 91 111* 118* 106*    Recent Results (from the past 240 hour(s))  Surgical pcr screen     Status: None   Collection Time: 09/20/15 10:51 AM  Result Value Ref Range Status   MRSA, PCR NEGATIVE NEGATIVE Final   Staphylococcus aureus NEGATIVE NEGATIVE Final    Comment:        The Xpert SA Assay (FDA approved for NASAL specimens in patients over 67 years of age), is one component of a comprehensive surveillance program.  Test performance has been validated by [facility] for patients greater than or equal to 66 year old. It is not intended to diagnose infection nor to guide or monitor treatment.      Scheduled Meds: . amoxicillin-clavulanate  1 tablet Oral Q12H  . antiseptic oral rinse  7 mL Mouth Rinse q12n4p  . chlorhexidine  15 mL Mouth Rinse BID  . dutasteride  0.5 mg Oral Daily  . enoxaparin (LOVENOX) injection  40 mg Subcutaneous Q24H  . indomethacin  100 mg Rectal Once  . insulin aspart  0-15 Units Subcutaneous TID WC  . insulin aspart  0-5 Units Subcutaneous QHS  . lisinopril  40 mg Oral Daily  . pantoprazole  40 mg Oral BID  . rosuvastatin  10 mg Oral q1800    Continuous Infusions: . sodium chloride 10 mL/hr at 09/23/15 1003

## 2015-09-24 DIAGNOSIS — K2980 Duodenitis without bleeding: Secondary | ICD-10-CM

## 2015-09-24 LAB — COMPREHENSIVE METABOLIC PANEL
ALT: 119 U/L — ABNORMAL HIGH (ref 17–63)
ANION GAP: 10 (ref 5–15)
AST: 87 U/L — ABNORMAL HIGH (ref 15–41)
Albumin: 2.4 g/dL — ABNORMAL LOW (ref 3.5–5.0)
Alkaline Phosphatase: 326 U/L — ABNORMAL HIGH (ref 38–126)
BILIRUBIN TOTAL: 3 mg/dL — AB (ref 0.3–1.2)
BUN: 17 mg/dL (ref 6–20)
CALCIUM: 8.3 mg/dL — AB (ref 8.9–10.3)
CO2: 23 mmol/L (ref 22–32)
Chloride: 104 mmol/L (ref 101–111)
Creatinine, Ser: 1.08 mg/dL (ref 0.61–1.24)
GFR calc Af Amer: >60 mL/min (ref >60)
Glucose, Bld: 126 mg/dL — ABNORMAL HIGH (ref 65–99)
POTASSIUM: 3.5 mmol/L (ref 3.5–5.1)
Sodium: 137 mmol/L (ref 135–145)
TOTAL PROTEIN: 5.5 g/dL — AB (ref 6.5–8.1)

## 2015-09-24 LAB — CBC
HEMATOCRIT: 36.1 % — AB (ref 39.0–52.0)
HEMOGLOBIN: 12 g/dL — AB (ref 13.0–17.0)
MCH: 31.5 pg (ref 26.0–34.0)
MCHC: 33.2 g/dL (ref 30.0–36.0)
MCV: 94.8 fL (ref 78.0–100.0)
Platelets: 261 10*3/uL (ref 150–400)
RBC: 3.81 MIL/uL — ABNORMAL LOW (ref 4.22–5.81)
RDW: 14.6 % (ref 11.5–15.5)
WBC: 8.9 10*3/uL (ref 4.0–10.5)

## 2015-09-24 LAB — GLUCOSE, CAPILLARY: GLUCOSE-CAPILLARY: 120 mg/dL — AB (ref 65–99)

## 2015-09-24 MED ORDER — DOCUSATE SODIUM 100 MG PO CAPS: 100.0000 mg | ORAL_CAPSULE | Freq: Two times a day (BID) | ORAL | Status: AC

## 2015-09-24 MED ORDER — AMOXICILLIN-POT CLAVULANATE 875-125 MG PO TABS: 1.0000 | ORAL_TABLET | Freq: Two times a day (BID) | ORAL | Status: AC

## 2015-09-24 MED ORDER — TRAMADOL HCL 50 MG PO TABS: 50.0000 mg | ORAL_TABLET | Freq: Two times a day (BID) | ORAL | Status: AC | PRN

## 2015-09-24 NOTE — Progress Notes (Signed)
[facility] Progress Note  4 Days Post-Op  Subjective: Pt doing well.  No N/V, tolerating diet.  Having good flatus, but no BM yet.  Pain well controlled.  Ambulating well.  Urinating much easier.  No complaints.  Happy to go home and happy with the care he received.    Objective: Vital signs in last 24 hours: Temp:  [97.6 F (36.4 C)-98.2 F (36.8 C)] 97.6 F (36.4 C) (02/03 0609) Pulse Rate:  [52-84] 58 (02/03 0609) Resp:  [18] 18 (02/03 0609) BP: (88-123)/(58-76) 123/76 mmHg (02/03 0609) SpO2:  [94 %-96 %] 94 % (02/03 0609) Last BM Date: 09/18/15  Intake/Output from previous day: 02/02 0701 - 02/03 0700 In: 964.5 [P.O.:480; I.V.:484.5] Out: 685 [Urine:685] Intake/Output this shift:    PE: Gen:  Alert, NAD, pleasant Abd: Soft, mild distension, NT, +BS, no HSM, incisions C/D/I with numerous staples in place   Lab Results:   Recent Labs  09/22/15 0509 09/24/15 0447  WBC 8.0 8.9  HGB 11.7* 12.0*  HCT 35.4* 36.1*  PLT 243 261   BMET  Recent Labs  09/23/15 0432 09/24/15 0447  NA 138 137  K 3.5 3.5  CL 106 104  CO2 24 23  GLUCOSE 124* 126*  BUN 17 17  CREATININE 1.19 1.08  CALCIUM 8.3* 8.3*   PT/INR No results for input(s): LABPROT, INR in the last 72 hours. CMP     Component Value Date/Time   NA 137 09/24/2015 0447   K 3.5 09/24/2015 0447   CL 104 09/24/2015 0447   CO2 23 09/24/2015 0447   GLUCOSE 126* 09/24/2015 0447   BUN 17 09/24/2015 0447   CREATININE 1.08 09/24/2015 0447   CALCIUM 8.3* 09/24/2015 0447   PROT 5.5* 09/24/2015 0447   ALBUMIN 2.4* 09/24/2015 0447   AST 87* 09/24/2015 0447   ALT 119* 09/24/2015 0447   ALKPHOS 326* 09/24/2015 0447   BILITOT 3.0* 09/24/2015 0447   GFRNONAA >60 09/24/2015 0447   GFRAA >60 09/24/2015 0447   Lipase  No results found for: LIPASE     Studies/Results: No results found.  Anti-infectives: Anti-infectives    Start     Dose/Rate Route Frequency Ordered Stop   09/20/15 1030   cefTRIAXone (ROCEPHIN) 2 g in dextrose 5 % 50 mL IVPB  Status:  Discontinued    Comments:  Pharmacy may adjust dosing strength, interval, or rate of medication as needed for optimal therapy for the patient Send with patient on call to the OR.  Anesthesia to complete antibiotic administration <77min prior to incision per [facility].   2 g 100 mL/hr over 30 Minutes Intravenous On call to O.R. 09/20/15 1026 09/21/15 0820   09/20/15 1000  amoxicillin-clavulanate (AUGMENTIN) 875-125 MG per tablet 1 tablet     1 tablet Oral Every 12 hours 09/20/15 0839 09/25/15 0959   09/20/15 0000  amoxicillin-clavulanate (AUGMENTIN) 875-125 MG tablet     1 tablet Oral Every 12 hours 09/20/15 0952     09/18/15 1400  piperacillin-tazobactam (ZOSYN) IVPB 3.375 g  Status:  Discontinued     3.375 g 12.5 mL/hr over 240 Minutes Intravenous 3 times per day 09/18/15 0914 09/20/15 0839   09/18/15 0715  piperacillin-tazobactam (ZOSYN) IVPB 3.375 g     3.375 g 100 mL/hr over 30 Minutes Intravenous  Once 09/18/15 [id number] 09/18/15 0831       Assessment/Plan Cholangitis s/p ERCP with sphincterotomy and stone removal Choledocholithiasis, Cholelithiasis POD #4 s/p Aborted laparoscopic and subsequent Open cholecystectomy with  LOA -LFT's improving -IVF, pain control, antiemetics -Continue antibiotics per GI for cholangitis, was on zosyn now on augmentin, f/u with GI as an OP -Tolerating soft diet -Wound open to air, staples out POD #10-14 days, shower after 7 days -Plan for d/c today.  Will arrange for staple removal and a post op check with Dr. Marlou Starks.  He will get his LFT's rechecked in 1 week to make sure they are trending down.  Urinary retention -Avadart, resolved, voiding on own -Cr normal    LOS: 6 days    Gary Roach 09/24/2015, 7:16 AM Pager: [phone]

## 2015-09-24 NOTE — Discharge Instructions (Addendum)
On Friday 10/01/15 go to [facility] lab to have your liver functions checked prior to your appointment at 2:00pm to have your staples removed.     [facility], [place] [phone]  OPEN ABDOMINAL SURGERY: POST OP INSTRUCTIONS  Always review your discharge instruction sheet given to you by the facility where your surgery was performed.  IF YOU HAVE DISABILITY OR FAMILY LEAVE FORMS, YOU MUST BRING THEM TO THE OFFICE FOR PROCESSING.  PLEASE DO NOT GIVE THEM TO YOUR DOCTOR.  1. A prescription for pain medication may be given to you upon discharge.  Take your pain medication as prescribed, if needed.  If narcotic pain medicine is not needed, then you may take acetaminophen (Tylenol) or ibuprofen (Advil) as needed. 2. Take your usually prescribed medications unless otherwise directed. 3. If you need a refill on your pain medication, please contact your pharmacy. They will contact our office to request authorization.  Prescriptions will not be filled after 5pm or on week-ends. 4. You should follow a light diet the first few days after arrival home, such as soup and crackers, pudding, etc.unless your doctor has advised otherwise. A high-fiber, low fat diet can be resumed as tolerated.   Be sure to include lots of fluids daily. Most patients will experience some swelling and bruising on the chest and neck area.  Ice packs will help.  Swelling and bruising can take several days to resolve 5. Most patients will experience some swelling and bruising in the area of the incision. Ice pack will help. Swelling and bruising can take several days to resolve..  6. It is common to experience some constipation if taking pain medication after surgery.  Increasing fluid intake and taking a stool softener will usually help or prevent this problem from occurring.  A mild laxative (Milk of Magnesia or Miralax) should be taken according to package directions if there are no bowel movements after 48 hours. 7.   You may have steri-strips (small skin tapes) in place directly over the incision.  These strips should be left on the skin for 7-10 days.  If your surgeon used skin glue on the incision, you may shower in 24 hours.  The glue will flake off over the next 2-3 weeks.  Any sutures or staples will be removed at the office during your follow-up visit. You may find that a light gauze bandage over your incision may keep your staples from being rubbed or pulled. You may shower and replace the bandage daily. 8. ACTIVITIES:  You may resume regular (light) daily activities beginning the next day--such as daily self-care, walking, climbing stairs--gradually increasing activities as tolerated.  You may have sexual intercourse when it is comfortable.  Refrain from any heavy lifting or straining until approved by your doctor. a. You may drive when you no longer are taking prescription pain medication, you can comfortably wear a seatbelt, and you can safely maneuver your car and apply brakes b. Return to Work: ___________________________________ 61. You should see your doctor in the office for a follow-up appointment approximately two weeks after your surgery.  Make sure that you call for this appointment within a day or two after you arrive home to insure a convenient appointment time. OTHER INSTRUCTIONS:  _____________________________________________________________ _____________________________________________________________  WHEN TO CALL YOUR DOCTOR: 1. Fever over 101.0 2. Inability to urinate 3. Nausea and/or vomiting 4. Extreme swelling or bruising 5. Continued bleeding from incision. 6. Increased pain, redness, or drainage from the incision.  7. Difficulty swallowing or breathing 8. Muscle cramping or spasms. 9. Numbness or tingling in hands or feet or around lips.  The clinic staff is available to answer your questions during regular business hours.  Please dont hesitate to call and ask to speak to one of  the nurses if you have concerns.  For further questions, please visit [email or web address]

## 2015-09-24 NOTE — Discharge Summary (Addendum)
Physician Discharge Summary  Gary Roach CHE:[id number] DOB: [birth year] DOA: 09/18/2015  PCP: Sheela Stack, MD  Admit date: 09/18/2015 Discharge date: 09/24/2015  Recommendations for Outpatient Follow-up:  Take protonix twice a day for 2 months.  Avoid any over-the-counter pain relievers except for tylenol (no aspirin, Motrin, Aleve, Ibuprofen, Naprosyn, etc).  Call your doctor for any recurrent black or bloody stools, dizziness, weakness, or feeling like you might pass out.  Augmentin for 7 days and discharge.  Discharge Diagnoses:  Principal Problem:   Acute cholangitis Active Problems:   Choledocholithiasis   Hypertension   Nephrolithiasis   Hyperlipidemia associated with type 2 diabetes mellitus (HCC)   Type 2 diabetes mellitus (HCC)   Acute kidney injury ([place])   Right flank pain   Elevated LFTs   Choledocholithiasis with obstruction   Duodenitis   Common bile duct calculi   Urinary retention    Discharge Condition: stable   Diet recommendation: as tolerated   History of present illness:  75 y.o. male with a PMH of hypertension and diabetes, prior nephrolithiasis who was admitted 09/18/15 with a chief complaint of the sudden onset of right flank pain accompanied by nausea and vomiting.  Hospital Course:   Assessment/Plan:    Principal Problem:  Choledocholithiasis with cholangitis with obstruction - Continue Augmentin for 7 days on discharge since he was on abx since 1/28 and today is day 7 of abx. - S/P ERCP with sphincterotomy & CBD stone removal 09/19/15 and cholecystectomy 09/20/15. - Recheck LFTs in outpatient setting - LFT trend in hospital as below: Hepatic Function Latest Ref Rng 09/24/2015 09/23/2015 09/22/2015  Total Protein 6.5 - 8.1 g/dL 5.5(L) 5.5(L) 5.5(L)  Albumin 3.5 - 5.0 g/dL 2.4(L) 2.4(L) 2.4(L)  AST 15 - 41 U/L 87(H) 94(H) 80(H)  ALT 17 - 63 U/L 119(H) 121(H) 101(H)  Alk Phosphatase 38 - 126 U/L 326(H) 372(H) 407(H)  Total  Bilirubin 0.3 - 1.2 mg/dL 3.0(H) 3.2(H) 2.6(H)    Duodenitis - PPI x 2 months. No NSAIDS/ASA. - F/U with Dr. Earlean Shawl.  Active Problems:  Acute urinary retention - Continue Avodart.   Hypertension, essential - May continue lisinopril on discharge   Nephrolithiasis - Continue Avodart.   Hyperlipidemia associated with type 2 diabetes mellitus (HCC) - Continue Crestor.   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  - Cr as high as 1.5 liekly due to acute illness, infection - Now WNL  DVT Prophylaxis  - Lovenox subQ in hospital    Code Status: Full.  Family Communication: plan of care discussed with the patient   IV access:  Peripheral IV  Procedures and diagnostic studies:   Ct Renal Stone Study 09/18/2015 Nonobstructing stone in the lower pole right kidney. There is an obstructing stone in the distal common bile duct with proximal intra and extrahepatic bile duct dilatation. Cholelithiasis. Enlarged prostate gland. Electronically Signed By: Lucienne Capers M.D. On: 09/18/2015 02:02   US Abdomen Limited Ruq 09/18/2015 1. Dilatation of the common bile duct and intrahepatic biliary ductal dilatation, reflecting the obstructing stone at the distal common bile duct as seen on CT. The known obstructing stone is not characterized on this study due to overlying bowel gas. 2. Underlying cholelithiasis. No evidence for cholecystitis. Electronically Signed By: Garald Balding M.D. On: 09/18/2015 06:36    Medical Consultants:  Surgery [facility]   IAnti-Infectives:   Zosyn 09/18/15--->09/20/15 Augmentin 09/20/15---> for 7 days on discharge   Signed:  Leisa Lenz, MD  [facility] 09/24/2015, 10:12 AM  Pager #: [phone]  Time spent in minutes: less than 30 minutes   Discharge Exam: Filed Vitals:   09/23/15 2210 09/24/15 0609  BP: 108/60 123/76  Pulse: 52 58  Temp:  98.1 F (36.7 C) 97.6 F (36.4 C)  Resp: 18 18   Filed Vitals:   09/23/15 0949 09/23/15 1500 09/23/15 2210 09/24/15 0609  BP: 115/68 88/58 108/60 123/76  Pulse:  84 52 58  Temp:  98.2 F (36.8 C) 98.1 F (36.7 C) 97.6 F (36.4 C)  TempSrc:  Axillary Oral Oral  Resp:  18 18 18   Height:      Weight:      SpO2:  96% 95% 94%    General: Pt is alert, follows commands appropriately, not in acute distress Cardiovascular: Regular rate and rhythm, S1/S2 +, no murmurs Respiratory: Clear to auscultation bilaterally, no wheezing, no crackles, no rhonchi Abdominal: Soft, non tender, non distended, bowel sounds +, no guarding Extremities: no edema, no cyanosis, pulses palpable bilaterally DP and PT Neuro: Grossly nonfocal  Discharge Instructions  Discharge Instructions    Call MD for:  difficulty breathing, headache or visual disturbances    Complete by:  As directed      Call MD for:  persistant nausea and vomiting    Complete by:  As directed      Call MD for:  persistant nausea and vomiting    Complete by:  As directed      Call MD for:  severe uncontrolled pain    Complete by:  As directed      Call MD for:  severe uncontrolled pain    Complete by:  As directed      Call MD for:  temperature >100.4    Complete by:  As directed      Diet - low sodium heart healthy    Complete by:  As directed   Carbohydrate modified.     Diet - low sodium heart healthy    Complete by:  As directed      Discharge instructions    Complete by:  As directed   Take protonix twice a day for 2 months.  Avoid any over-the-counter pain relievers except for tylenol (no aspirin, Motrin, Aleve, Ibuprofen, Naprosyn, etc).  Call your doctor for any recurrent black or bloody stools, dizziness, weakness, or feeling like you might pass out.     Increase activity slowly    Complete by:  As directed      Increase activity slowly    Complete by:  As directed             Medication List    STOP taking  these medications        aspirin EC 81 MG tablet     predniSONE 5 MG tablet  Commonly known as:  DELTASONE      TAKE these medications        ALPRAZolam 0.5 MG tablet  Commonly known as:  XANAX  Take 0.5 mg by mouth 2 (two) times daily as needed for anxiety.     amoxicillin-clavulanate 875-125 MG tablet  Commonly known as:  AUGMENTIN  Take 1 tablet by mouth every 12 (twelve) hours.     docusate sodium 100 MG capsule  Commonly known as:  COLACE  Take 1 capsule (100 mg total) by mouth 2 (two) times daily. Generic stool softener is fine.     dutasteride 0.5 MG capsule  Commonly known as:  AVODART  Take 0.5 mg by mouth daily.     ergocalciferol 50000 units capsule  Commonly known as:  VITAMIN D2  Take 50,000 Units by mouth once a week.     lisinopril 40 MG tablet  Commonly known as:  PRINIVIL,ZESTRIL  Take 40 mg by mouth daily.     pantoprazole 40 MG tablet  Commonly known as:  PROTONIX  Take 1 tablet (40 mg total) by mouth 2 (two) times daily.     pioglitazone 15 MG tablet  Commonly known as:  ACTOS  Take 15 mg by mouth daily.     rosuvastatin 10 MG tablet  Commonly known as:  CRESTOR  Take 10 mg by mouth daily.     traMADol 50 MG tablet  Commonly known as:  ULTRAM  Take 1 tablet (50 mg total) by mouth every 12 (twelve) hours as needed for moderate pain.     triamcinolone cream 0.1 %  Commonly known as:  KENALOG  Apply 1 application topically 2 (two) times daily as needed (itching).            Follow-up Information    Schedule an appointment as soon as possible for a visit with Sheela Stack, MD.   Specialty:  Endocrinology   Why:  As needed for your routine regular follow up.   Contact information:   [address] [place] [address] [phone]       Follow up with Merrie Roof, MD. Go on 10/13/2015.   Specialty:  General Surgery   Why:  For post-operation check with Dr. Marlou Starks on 10/12/14 at 3:00pm, please arrive 15 minutes early to  check in.   Contact information:   [address] [place] [address] [phone]       Follow up with [facility]. Go on 10/01/2015.   Specialty:  General Surgery   Why:  For staple removal at 2:00pm, please arrive 30 min early to check in and fill out paperwork.  Get your labs done before arriving for your appointment.   Contact information:   [address] [place] [phone]      Go on 10/01/2015 to follow up.   Why:  Please go to [facility] at [address], second floor of our office building prior to your staple removal appointment.  They will check your liver functions to make sure they are improving.      Follow up with Sheela Stack, MD. Schedule an appointment as soon as possible for a visit in 1 week.   Specialty:  Endocrinology   Why:  Follow up appt after recent hospitalization   Contact information:   [place] [address] [phone]        The results of significant diagnostics from this hospitalization (including imaging, microbiology, ancillary and laboratory) are listed below for reference.    Significant Diagnostic Studies: Dg Ercp Biliary & Pancreatic Ducts  09/19/2015  CLINICAL DATA:  Common bile duct dilatation and known gallstones EXAM: ERCP TECHNIQUE: Multiple spot images obtained with the fluoroscopic device and submitted for interpretation post-procedure. FLUOROSCOPY TIME:  Radiation Exposure Index (as provided by the fluoroscopic device): 36.88 mGy If the device does not provide the exposure index: Fluoroscopy Time:  2 minutes 30 seconds Number of Acquired Images:  11 COMPARISON:  09/18/2015 FINDINGS: Initial images demonstrate a wire extending into the common bile duct. Dilatation of the common bile duct is noted. A  sphincterotomy was performed and the balloon sweep of the duct was performed. By history this produced a common bile duct stone. IMPRESSION:  Common bile duct dilatation secondary to distal common bile duct stone. This was removed during the exam. These images were submitted for radiologic interpretation only. Please see the procedural report for the amount of contrast and the fluoroscopy time utilized. Electronically Signed   By: Inez Catalina M.D.   On: 09/19/2015 09:26   Ct Renal Stone Study  09/18/2015  CLINICAL DATA:  Right flank pain, nausea, and vomiting with sudden onset last night. History of kidney stones. EXAM: CT ABDOMEN AND PELVIS WITHOUT CONTRAST TECHNIQUE: Multidetector CT imaging of the abdomen and pelvis was performed following the standard protocol without IV contrast. COMPARISON:  06/28/2009 FINDINGS: Atelectasis in the lung bases. 15 mm stone in the lower pole right kidney. No hydronephrosis or hydroureter on either side. No ureteral stones or bladder stones identified. Bladder wall is not thickened. There is a stone in the distal common bile duct with prominent dilatation of the intra and extrahepatic bile ducts. Several gallstones are also present. No obvious gallbladder wall thickening. Fatty infiltration of the pancreas. The spleen, adrenal glands, abdominal aorta, inferior vena cava, and retroperitoneal lymph nodes are unremarkable. Small esophageal hiatal hernia. Stomach, small bowel, and colon are not abnormally distended. Scattered diverticula in the colon. No free air or free fluid in the abdomen. Abdominal wall musculature appears intact. Prominent visceral adipose tissues. Pelvis: Fat in the inguinal canals bilaterally. The appendix is not identified. Scattered diverticula in the sigmoid colon without evidence of diverticulitis. Prostate gland is enlarged, measuring 5.1 cm diameter. Prostatic impression upon the bladder base. Bladder wall is not thickened. The no free or loculated pelvic fluid collections. Degenerative changes in the spine. No destructive bone lesions. IMPRESSION: Nonobstructing stone in the lower pole  right kidney. There is an obstructing stone in the distal common bile duct with proximal intra and extrahepatic bile duct dilatation. Cholelithiasis. Enlarged prostate gland. Electronically Signed   By: Lucienne Capers M.D.   On: 09/18/2015 02:02   US Abdomen Limited Ruq  09/18/2015  CLINICAL DATA:  Obstructing stone at the distal common bile duct on CT. Further evaluation requested. Initial encounter. EXAM: US ABDOMEN LIMITED - RIGHT UPPER QUADRANT COMPARISON:  CT of the abdomen and pelvis performed earlier today at 1:51 a.m. FINDINGS: Gallbladder: Stones are again noted within the gallbladder, measuring up to 2.2 cm in size. No definite gallbladder wall thickening or pericholecystic fluid is seen. No ultrasonographic Murphy's sign is elicited. Common bile duct: Diameter: 1.1 cm; dilated, as noted on CT, reflecting the distal obstructing stone. The known obstructing stone is not seen on this study due to overlying bowel gas. Liver: No focal lesion identified. Intrahepatic biliary duct dilatation is better characterized on recent CT. Within normal limits in parenchymal echogenicity. IMPRESSION: 1. Dilatation of the common bile duct and intrahepatic biliary ductal dilatation, reflecting the obstructing stone at the distal common bile duct as seen on CT. The known obstructing stone is not characterized on this study due to overlying bowel gas. 2. Underlying cholelithiasis.  No evidence for cholecystitis. Electronically Signed   By: Garald Balding M.D.   On: 09/18/2015 06:36    Microbiology: Recent Results (from the past 240 hour(s))  Surgical pcr screen     Status: None   Collection Time: 09/20/15 10:51 AM  Result Value Ref Range Status   MRSA, PCR NEGATIVE NEGATIVE Final   Staphylococcus aureus NEGATIVE  NEGATIVE Final    Comment:        The Xpert SA Assay (FDA approved for NASAL specimens in patients over 18 years of age), is one component of a comprehensive surveillance program.  Test performance  has been validated by [facility] for patients greater than or equal to 14 year old. It is not intended to diagnose infection nor to guide or monitor treatment.      Labs: Basic Metabolic Panel:  Recent Labs Lab 09/20/15 0530 09/21/15 0505 09/22/15 0509 09/23/15 0432 09/24/15 0447  NA 138 140 139 138 137  K 4.4 4.3 3.8 3.5 3.5  CL 107 107 107 106 104  CO2 22 23 25 24 23   GLUCOSE 152* 123* 102* 124* 126*  BUN 29* 31* 19 17 17   CREATININE 1.42* 1.54* 1.22 1.19 1.08  CALCIUM 8.5* 8.7* 8.7* 8.3* 8.3*   Liver Function Tests:  Recent Labs Lab 09/20/15 0530 09/21/15 0505 09/22/15 0509 09/23/15 0432 09/24/15 0447  AST 86* 80* 80* 94* 87*  ALT 131* 117* 101* 121* 119*  ALKPHOS 490* 459* 407* 372* 326*  BILITOT 3.7* 2.7* 2.6* 3.2* 3.0*  PROT 5.4* 5.5* 5.5* 5.5* 5.5*  ALBUMIN 2.3* 2.5* 2.4* 2.4* 2.4*   No results for input(s): LIPASE, AMYLASE in the last 168 hours. No results for input(s): AMMONIA in the last 168 hours. CBC:  Recent Labs Lab 09/18/15 0342 09/19/15 0510 09/22/15 0509 09/24/15 0447  WBC 17.4* 6.7 8.0 8.9  NEUTROABS 15.3*  --   --   --   HGB 13.3 12.6* 11.7* 12.0*  HCT 40.4 38.3* 35.4* 36.1*  MCV 94.0 94.3 93.9 94.8  PLT 272 263 243 261   Cardiac Enzymes: No results for input(s): CKTOTAL, CKMB, CKMBINDEX, TROPONINI in the last 168 hours. BNP: BNP (last 3 results) No results for input(s): BNP in the last 8760 hours.  ProBNP (last 3 results) No results for input(s): PROBNP in the last 8760 hours.  CBG:  Recent Labs Lab 09/23/15 0738 09/23/15 1213 09/23/15 1735 09/23/15 2206 09/24/15 0747  GLUCAP 118* 106* 107* 136* 120*

## 2015-09-24 NOTE — Progress Notes (Signed)
Patient alert and oriented with pain controlled. Patient given discharge instructions and prescriptions. Patient verbalized understanding of instructions. All questions answered.

## 2015-10-01 DIAGNOSIS — R300 Dysuria: Secondary | ICD-10-CM | POA: Diagnosis not present

## 2015-10-01 DIAGNOSIS — R8290 Unspecified abnormal findings in urine: Secondary | ICD-10-CM | POA: Diagnosis not present

## 2015-10-01 DIAGNOSIS — Z9049 Acquired absence of other specified parts of digestive tract: Secondary | ICD-10-CM | POA: Diagnosis not present

## 2015-10-08 DIAGNOSIS — Z6834 Body mass index (BMI) 34.0-34.9, adult: Secondary | ICD-10-CM | POA: Diagnosis not present

## 2015-10-08 DIAGNOSIS — R300 Dysuria: Secondary | ICD-10-CM | POA: Diagnosis not present

## 2015-10-08 DIAGNOSIS — N200 Calculus of kidney: Secondary | ICD-10-CM | POA: Diagnosis not present

## 2015-10-08 DIAGNOSIS — I10 Essential (primary) hypertension: Secondary | ICD-10-CM | POA: Diagnosis not present

## 2015-10-08 DIAGNOSIS — K2980 Duodenitis without bleeding: Secondary | ICD-10-CM | POA: Diagnosis not present

## 2015-10-08 DIAGNOSIS — N9989 Other postprocedural complications and disorders of genitourinary system: Secondary | ICD-10-CM | POA: Diagnosis not present

## 2015-10-08 DIAGNOSIS — E119 Type 2 diabetes mellitus without complications: Secondary | ICD-10-CM | POA: Diagnosis not present

## 2015-10-08 DIAGNOSIS — Z9089 Acquired absence of other organs: Secondary | ICD-10-CM | POA: Diagnosis not present

## 2015-10-08 DIAGNOSIS — R945 Abnormal results of liver function studies: Secondary | ICD-10-CM | POA: Diagnosis not present

## 2015-10-13 ENCOUNTER — Emergency Department (HOSPITAL_COMMUNITY)
Admission: EM | Admit: 2015-10-13 | Discharge: 2015-10-13 | Disposition: A | Discharge status: Home / Self Care | Payer: Medicare Other | Attending: Emergency Medicine | Admitting: Emergency Medicine

## 2015-10-13 ENCOUNTER — Encounter (HOSPITAL_COMMUNITY): Payer: Self-pay

## 2015-10-13 DIAGNOSIS — T83038A Leakage of other indwelling urethral catheter, initial encounter: Secondary | ICD-10-CM | POA: Insufficient documentation

## 2015-10-13 DIAGNOSIS — Z87448 Personal history of other diseases of urinary system: Secondary | ICD-10-CM | POA: Diagnosis not present

## 2015-10-13 DIAGNOSIS — N39490 Overflow incontinence: Secondary | ICD-10-CM | POA: Diagnosis not present

## 2015-10-13 DIAGNOSIS — Y658 Other specified misadventures during surgical and medical care: Secondary | ICD-10-CM | POA: Insufficient documentation

## 2015-10-13 DIAGNOSIS — I10 Essential (primary) hypertension: Secondary | ICD-10-CM | POA: Diagnosis not present

## 2015-10-13 DIAGNOSIS — E119 Type 2 diabetes mellitus without complications: Secondary | ICD-10-CM | POA: Insufficient documentation

## 2015-10-13 DIAGNOSIS — Z87442 Personal history of urinary calculi: Secondary | ICD-10-CM | POA: Diagnosis not present

## 2015-10-13 DIAGNOSIS — Z7982 Long term (current) use of aspirin: Secondary | ICD-10-CM | POA: Insufficient documentation

## 2015-10-13 DIAGNOSIS — T839XXA Unspecified complication of genitourinary prosthetic device, implant and graft, initial encounter: Secondary | ICD-10-CM

## 2015-10-13 DIAGNOSIS — Z7984 Long term (current) use of oral hypoglycemic drugs: Secondary | ICD-10-CM | POA: Insufficient documentation

## 2015-10-13 DIAGNOSIS — E785 Hyperlipidemia, unspecified: Secondary | ICD-10-CM | POA: Diagnosis not present

## 2015-10-13 DIAGNOSIS — Z0000 Encounter for general adult medical examination without abnormal findings: Secondary | ICD-10-CM | POA: Diagnosis not present

## 2015-10-13 DIAGNOSIS — T83091A Other mechanical complication of indwelling urethral catheter, initial encounter: Secondary | ICD-10-CM | POA: Diagnosis not present

## 2015-10-13 DIAGNOSIS — R339 Retention of urine, unspecified: Secondary | ICD-10-CM | POA: Diagnosis present

## 2015-10-13 NOTE — ED Notes (Signed)
Pt had a catheter placed today and it became clogged, he went back and they replaced it and removed clots, tonight he continues to have leakage around his catheter and theres blood in his urine, pt was told to come to the ER if he had anymore problems. Pt's blood pressure is also low.

## 2015-10-13 NOTE — ED Notes (Signed)
Patient was alert, oriented and stable upon discharge. RN went over AVS and patient had no further questions.  

## 2015-10-13 NOTE — ED Provider Notes (Signed)
CSN: [id number]     Arrival date & time 10/13/15  1920 History   First MD Initiated Contact with Patient 10/13/15 2017     Chief Complaint  Patient presents with  . Urinary Retention     (Consider location/radiation/quality/duration/timing/severity/associated sxs/prior Treatment) HPI Comments: Patient is a 75 year old male with history of type 2 diabetes, renal calculus. He presents for evaluation of a Foley catheter problem. He recently had a stent placed in his biliary duct. Postoperatively, he developed urinary retention that required a Foley catheter. This stopped draining and he was evaluated earlier today by his urologist. A larger Foley catheter was placed and the bladder was irrigated until his urine was pink in color. He presents now with urine leaking around his Foley catheter, and no urine emptying into the bag.  The history is provided by the patient.    Past Medical History  Diagnosis Date  . Hypertension   . Diabetes mellitus   . Renal disorder   . Nephrolithiasis   . Hyperlipidemia associated with type 2 diabetes mellitus [facility])    Past Surgical History  Procedure Laterality Date  . Knee arthroscopy Left   . Lumbar fusion      L4-L5  . Cholecystectomy N/A 09/20/2015    Procedure: DIAGNOSTIC LAPAROSCOPY, LYSIS OF AD HESIONS CONVERTED TO OPEN CHOLECYSTECTOMY ;  Surgeon: Autumn Messing III, MD;  Location: [facility]S;  Service: General;  Laterality: N/A;  . Laparoscopic lysis of adhesions  09/20/2015    Procedure: LAPAROSCOPIC LYSIS OF ADHESIONS;  Surgeon: Autumn Messing III, MD;  Location: [facility];  Service: General;;   Family History  Problem Relation Age of Onset  . Dementia Mother    Social History  Substance Use Topics  . Smoking status: Never Smoker   . Smokeless tobacco: None  . Alcohol Use: 0.0 oz/week    0 Standard drinks or equivalent per week     Comment: Occasionally/Social    Review of Systems  All other systems reviewed and are negative.     Allergies   Chocolate  Home Medications   Prior to Admission medications   Medication Sig Start Date End Date Taking? Authorizing Provider  ALPRAZolam Duanne Moron) 0.5 MG tablet Take 0.5 mg by mouth 2 (two) times daily as needed for anxiety.    Yes Historical Provider, MD  aspirin EC 81 MG tablet Take 81 mg by mouth daily.   Yes Historical Provider, MD  docusate sodium (COLACE) 100 MG capsule Take 1 capsule (100 mg total) by mouth 2 (two) times daily. Generic stool softener is fine. 09/24/15  Yes Nat Christen, PA-C  dutasteride (AVODART) 0.5 MG capsule Take 0.5 mg by mouth daily.   Yes Historical Provider, MD  lisinopril (PRINIVIL,ZESTRIL) 40 MG tablet Take 40 mg by mouth daily.   Yes Historical Provider, MD  pantoprazole (PROTONIX) 40 MG tablet Take 1 tablet (40 mg total) by mouth 2 (two) times daily. 09/20/15  Yes Christina P Rama, MD  pioglitazone (ACTOS) 15 MG tablet Take 15 mg by mouth daily.   Yes Historical Provider, MD  rosuvastatin (CRESTOR) 10 MG tablet Take 10 mg by mouth daily.   Yes Historical Provider, MD  traMADol (ULTRAM) 50 MG tablet Take 1 tablet (50 mg total) by mouth every 12 (twelve) hours as needed for moderate pain. 09/24/15  Yes Robbie Lis, MD  amoxicillin-clavulanate (AUGMENTIN) 875-125 MG tablet Take 1 tablet by mouth every 12 (twelve) hours. Patient not taking: Reported on 10/13/2015 09/24/15   Link Snuffer  Charlies Silvers, MD   BP 81/55 mmHg  Pulse 106  Temp(Src) 98 F (36.7 C)  Resp 20  SpO2 99% Physical Exam  Constitutional: He is oriented to person, place, and time. He appears well-developed and well-nourished. No distress.  HENT:  Head: Normocephalic and atraumatic.  Neck: Normal range of motion. Neck supple.  Cardiovascular: Normal rate, regular rhythm and normal heart sounds.   No murmur heard. Pulmonary/Chest: Effort normal and breath sounds normal. No respiratory distress. He has no wheezes.  Abdominal: Soft. Bowel sounds are normal. He exhibits no distension. There is no  tenderness. There is no rebound.  Musculoskeletal: Normal range of motion. He exhibits no edema.  Neurological: He is alert and oriented to person, place, and time.  Skin: Skin is warm and dry. He is not diaphoretic.  Nursing note and vitals reviewed.   ED Course  Procedures (including critical care time) Labs Review Labs Reviewed - No data to display  Imaging Review No results found. I have personally reviewed and evaluated these images and lab results as part of my medical decision-making.    MDM   Final diagnoses:  None    Patient is a 75 year old male who presents with complaints of leaking urine around his foley catheter. His catheter was flushed and clots were expressed and the bladder irrigated until the urine was a pinkish color. He is to follow-up with his urologist in the next 1-2 days, and return to the ER if symptoms significantly worsen or change.    Veryl Speak, MD 10/13/15 2241

## 2015-10-13 NOTE — Discharge Instructions (Signed)
Follow-up with your urologist in the next 1-2 days, and return to the ER if symptoms significantly worsen or change.

## 2015-10-14 DIAGNOSIS — R338 Other retention of urine: Secondary | ICD-10-CM | POA: Diagnosis not present

## 2015-10-14 DIAGNOSIS — R310 Gross hematuria: Secondary | ICD-10-CM | POA: Diagnosis not present

## 2015-10-15 DIAGNOSIS — R338 Other retention of urine: Secondary | ICD-10-CM | POA: Diagnosis not present

## 2015-10-15 DIAGNOSIS — R310 Gross hematuria: Secondary | ICD-10-CM | POA: Diagnosis not present

## 2015-10-16 ENCOUNTER — Emergency Department (HOSPITAL_COMMUNITY)
Admission: EM | Admit: 2015-10-16 | Discharge: 2015-10-16 | Disposition: A | Discharge status: Home / Self Care | Payer: Medicare Other | Attending: Emergency Medicine | Admitting: Emergency Medicine

## 2015-10-16 ENCOUNTER — Emergency Department (HOSPITAL_COMMUNITY)
Admission: EM | Admit: 2015-10-16 | Discharge: 2015-10-16 | Disposition: A | Discharge status: Home / Self Care | Payer: Medicare Other | Source: Home / Self Care | Attending: Emergency Medicine | Admitting: Emergency Medicine

## 2015-10-16 ENCOUNTER — Encounter (HOSPITAL_COMMUNITY): Payer: Self-pay | Admitting: Emergency Medicine

## 2015-10-16 DIAGNOSIS — R339 Retention of urine, unspecified: Secondary | ICD-10-CM

## 2015-10-16 DIAGNOSIS — E119 Type 2 diabetes mellitus without complications: Secondary | ICD-10-CM | POA: Insufficient documentation

## 2015-10-16 DIAGNOSIS — I10 Essential (primary) hypertension: Secondary | ICD-10-CM | POA: Insufficient documentation

## 2015-10-16 DIAGNOSIS — Y658 Other specified misadventures during surgical and medical care: Secondary | ICD-10-CM | POA: Diagnosis not present

## 2015-10-16 DIAGNOSIS — Z87442 Personal history of urinary calculi: Secondary | ICD-10-CM | POA: Insufficient documentation

## 2015-10-16 DIAGNOSIS — E785 Hyperlipidemia, unspecified: Secondary | ICD-10-CM

## 2015-10-16 DIAGNOSIS — Z87448 Personal history of other diseases of urinary system: Secondary | ICD-10-CM | POA: Insufficient documentation

## 2015-10-16 DIAGNOSIS — Z79899 Other long term (current) drug therapy: Secondary | ICD-10-CM | POA: Insufficient documentation

## 2015-10-16 DIAGNOSIS — Z7982 Long term (current) use of aspirin: Secondary | ICD-10-CM | POA: Diagnosis not present

## 2015-10-16 DIAGNOSIS — T83098A Other mechanical complication of other indwelling urethral catheter, initial encounter: Secondary | ICD-10-CM | POA: Diagnosis present

## 2015-10-16 DIAGNOSIS — Z7984 Long term (current) use of oral hypoglycemic drugs: Secondary | ICD-10-CM

## 2015-10-16 DIAGNOSIS — Z792 Long term (current) use of antibiotics: Secondary | ICD-10-CM

## 2015-10-16 DIAGNOSIS — Z960 Presence of urogenital implants: Secondary | ICD-10-CM | POA: Diagnosis not present

## 2015-10-16 NOTE — ED Provider Notes (Signed)
CSN: [id number]     Arrival date & time 10/16/15  0827 History   First MD Initiated Contact with Patient 10/16/15 [phone]     Chief Complaint  Patient presents with  . Foley catheter clogged      (Consider location/radiation/quality/duration/timing/severity/associated sxs/prior Treatment) HPI..... Patient has recently developed urinary retention requiring a Foley catheter. He is being followed by [facility] for this problem. Foley catheter has been blocked for the past several hours. No obvious urinary output. This is happened before requiring a larger Foley catheter and irrigation. No fever, sweats, chills.  Past Medical History  Diagnosis Date  . Hypertension   . Diabetes mellitus   . Renal disorder   . Nephrolithiasis   . Hyperlipidemia associated with type 2 diabetes mellitus [facility])    Past Surgical History  Procedure Laterality Date  . Knee arthroscopy Left   . Lumbar fusion      L4-L5  . Cholecystectomy N/A 09/20/2015    Procedure: DIAGNOSTIC LAPAROSCOPY, LYSIS OF AD HESIONS CONVERTED TO OPEN CHOLECYSTECTOMY ;  Surgeon: Autumn Messing III, MD;  Location: [facility];  Service: General;  Laterality: N/A;  . Laparoscopic lysis of adhesions  09/20/2015    Procedure: LAPAROSCOPIC LYSIS OF ADHESIONS;  Surgeon: Autumn Messing III, MD;  Location: [facility];  Service: General;;   Family History  Problem Relation Age of Onset  . Dementia Mother    Social History  Substance Use Topics  . Smoking status: Never Smoker   . Smokeless tobacco: None  . Alcohol Use: 0.0 oz/week    0 Standard drinks or equivalent per week     Comment: Occasionally/Social    Review of Systems  All other systems reviewed and are negative.     Allergies  Chocolate  Home Medications   Prior to Admission medications   Medication Sig Start Date End Date Taking? Authorizing Provider  ALPRAZolam Duanne Moron) 0.5 MG tablet Take 0.5 mg by mouth 2 (two) times daily as needed for anxiety.     Historical Provider, MD   amoxicillin-clavulanate (AUGMENTIN) 875-125 MG tablet Take 1 tablet by mouth every 12 (twelve) hours. Patient not taking: Reported on 10/13/2015 09/24/15   Robbie Lis, MD  aspirin EC 81 MG tablet Take 81 mg by mouth daily.    Historical Provider, MD  docusate sodium (COLACE) 100 MG capsule Take 1 capsule (100 mg total) by mouth 2 (two) times daily. Generic stool softener is fine. 09/24/15   Nat Christen, PA-C  dutasteride (AVODART) 0.5 MG capsule Take 0.5 mg by mouth daily.    Historical Provider, MD  lisinopril (PRINIVIL,ZESTRIL) 40 MG tablet Take 40 mg by mouth daily.    Historical Provider, MD  pantoprazole (PROTONIX) 40 MG tablet Take 1 tablet (40 mg total) by mouth 2 (two) times daily. 09/20/15   Christina P Rama, MD  pioglitazone (ACTOS) 15 MG tablet Take 15 mg by mouth daily.    Historical Provider, MD  rosuvastatin (CRESTOR) 10 MG tablet Take 10 mg by mouth daily.    Historical Provider, MD  traMADol (ULTRAM) 50 MG tablet Take 1 tablet (50 mg total) by mouth every 12 (twelve) hours as needed for moderate pain. 09/24/15   Robbie Lis, MD   BP 113/70 mmHg  Pulse 101  Temp(Src) 98 F (36.7 C) (Oral)  Resp 16  SpO2 94% Physical Exam  Constitutional: He is oriented to person, place, and time. He appears well-developed and well-nourished.  HENT:  Head: Normocephalic and atraumatic.  Eyes:  Conjunctivae and EOM are normal. Pupils are equal, round, and reactive to light.  Neck: Normal range of motion. Neck supple.  Cardiovascular: Normal rate and regular rhythm.   Pulmonary/Chest: Effort normal and breath sounds normal.  Abdominal: Soft. Bowel sounds are normal.  Genitourinary:  Foley catheter in place. Small amount of urine leaking around the catheter at the tip of the urethra  Musculoskeletal: Normal range of motion.  Neurological: He is alert and oriented to person, place, and time.  Skin: Skin is warm and dry.  Psychiatric: He has a normal mood and affect. His behavior is normal.   Nursing note and vitals reviewed.   ED Course  Procedures (including critical care time) Labs Review Labs Reviewed - No data to display  Imaging Review No results found. I have personally reviewed and evaluated these images and lab results as part of my medical decision-making.   EKG Interpretation None      MDM   Final diagnoses:  Urinary retention    Registered nurse irrigated the catheter. Soon after procedure, a large amount of urine flowed into the catheter bag. Patient may follow-up as an outpatient.    Nat Christen, MD 10/16/15 1120

## 2015-10-16 NOTE — ED Notes (Signed)
Pt was here this am for a blocked foley catheter. Had foley flushed, but began to become clogged again after returning home.

## 2015-10-16 NOTE — ED Provider Notes (Signed)
CSN: [id number]     Arrival date & time 10/16/15  1355 History   First MD Initiated Contact with Patient 10/16/15 1501     Chief Complaint  Patient presents with  . Urinary Retention     (Consider location/radiation/quality/duration/timing/severity/associated sxs/prior Treatment) HPI   Has a foley catheter with some dark urine. Has had problems with it clogging over last few days, seen here earlier today with the same, after dc noticed the foley was leaking around the tube. No pain or discomfort. No dislodging of foley. At time of my interview the foley bag is full with dark urine. No abdominal complaitns or complaints otherwise. H/o same. No other associated symptoms.   Past Medical History  Diagnosis Date  . Hypertension   . Diabetes mellitus   . Renal disorder   . Nephrolithiasis   . Hyperlipidemia associated with type 2 diabetes mellitus [facility])    Past Surgical History  Procedure Laterality Date  . Knee arthroscopy Left   . Lumbar fusion      L4-L5  . Cholecystectomy N/A 09/20/2015    Procedure: DIAGNOSTIC LAPAROSCOPY, LYSIS OF AD HESIONS CONVERTED TO OPEN CHOLECYSTECTOMY ;  Surgeon: Autumn Messing III, MD;  Location: [facility];  Service: General;  Laterality: N/A;  . Laparoscopic lysis of adhesions  09/20/2015    Procedure: LAPAROSCOPIC LYSIS OF ADHESIONS;  Surgeon: Autumn Messing III, MD;  Location: [facility];  Service: General;;   Family History  Problem Relation Age of Onset  . Dementia Mother    Social History  Substance Use Topics  . Smoking status: Never Smoker   . Smokeless tobacco: None  . Alcohol Use: 0.0 oz/week    0 Standard drinks or equivalent per week     Comment: Occasionally/Social    Review of Systems  Constitutional: Negative for fever and chills.  Eyes: Negative for photophobia and pain.  Respiratory: Negative for cough and shortness of breath.   Gastrointestinal: Negative for nausea and vomiting.  All other systems reviewed and are negative.     Allergies   Chocolate  Home Medications   Prior to Admission medications   Medication Sig Start Date End Date Taking? Authorizing Provider  ALPRAZolam Duanne Moron) 0.5 MG tablet Take 0.5 mg by mouth 2 (two) times daily as needed for anxiety.    Yes Historical Provider, MD  cephALEXin (KEFLEX) 500 MG capsule Take 500 mg by mouth 2 (two) times daily. 10/15/15  Yes Historical Provider, MD  dutasteride (AVODART) 0.5 MG capsule Take 0.5 mg by mouth daily.   Yes Historical Provider, MD  lisinopril (PRINIVIL,ZESTRIL) 40 MG tablet Take 40 mg by mouth daily.   Yes Historical Provider, MD  pantoprazole (PROTONIX) 40 MG tablet Take 1 tablet (40 mg total) by mouth 2 (two) times daily. 09/20/15  Yes Christina P Rama, MD  pioglitazone (ACTOS) 15 MG tablet Take 15 mg by mouth daily.   Yes Historical Provider, MD  rosuvastatin (CRESTOR) 10 MG tablet Take 10 mg by mouth daily.   Yes Historical Provider, MD  tamsulosin (FLOMAX) 0.4 MG CAPS capsule Take 0.4 mg by mouth daily.   Yes Historical Provider, MD  traMADol (ULTRAM) 50 MG tablet Take 1 tablet (50 mg total) by mouth every 12 (twelve) hours as needed for moderate pain. 09/24/15  Yes Robbie Lis, MD  amoxicillin-clavulanate (AUGMENTIN) 875-125 MG tablet Take 1 tablet by mouth every 12 (twelve) hours. Patient not taking: Reported on 10/13/2015 09/24/15   Robbie Lis, MD  docusate sodium (COLACE) 100  MG capsule Take 1 capsule (100 mg total) by mouth 2 (two) times daily. Generic stool softener is fine. Patient not taking: Reported on 10/16/2015 09/24/15   Nat Christen, PA-C   BP 120/62 mmHg  Pulse 62  Temp(Src) 97.8 F (36.6 C) (Oral)  Resp 18  SpO2 98% Physical Exam  Constitutional: He is oriented to person, place, and time. He appears well-developed and well-nourished.  HENT:  Head: Normocephalic and atraumatic.  Neck: Normal range of motion.  Cardiovascular: Normal rate.   Pulmonary/Chest: Effort normal. No respiratory distress. He has no wheezes. He has no rales.   Abdominal: Soft. He exhibits no distension. There is no tenderness. There is no rebound and no guarding.  Musculoskeletal: Normal range of motion. He exhibits no edema or tenderness.  Neurological: He is alert and oriented to person, place, and time.  Nursing note and vitals reviewed.   ED Course  Procedures (including critical care time) Labs Review Labs Reviewed - No data to display  Imaging Review No results found. I have personally reviewed and evaluated these images and lab results as part of my medical decision-making.   EKG Interpretation None      MDM   Final diagnoses:  Urinary retention   Possibly clogged foley v incompletely filled balloon on catheter. Draining well now. No draining around it. Will empty bag and balloon, refil balloon with appropriate amount and observe to ensure it is working appropriately.   Foley working appropriately after prolonged observation. Will dc w/ scheduled urology follow up.     Merrily Pew, MD 10/16/15 [phone]

## 2015-10-16 NOTE — Discharge Instructions (Signed)
Follow-up with urologist on Monday. Return here if symptoms return

## 2015-10-16 NOTE — ED Notes (Signed)
Pt reports his foley catheter began to get clogged this am at 0300 with blood clots. Foley catheter placed yesterday.

## 2015-10-16 NOTE — ED Notes (Signed)
Catheter irrigated with 60cc sterile water and 700cc of urine emptied following with clots present.

## 2015-10-18 DIAGNOSIS — N401 Enlarged prostate with lower urinary tract symptoms: Secondary | ICD-10-CM | POA: Diagnosis not present

## 2015-10-18 DIAGNOSIS — R338 Other retention of urine: Secondary | ICD-10-CM | POA: Diagnosis not present

## 2015-10-18 DIAGNOSIS — R310 Gross hematuria: Secondary | ICD-10-CM | POA: Diagnosis not present

## 2015-10-20 DIAGNOSIS — R338 Other retention of urine: Secondary | ICD-10-CM | POA: Diagnosis not present

## 2016-02-10 DIAGNOSIS — R972 Elevated prostate specific antigen [PSA]: Secondary | ICD-10-CM | POA: Diagnosis not present

## 2016-02-10 DIAGNOSIS — Z6835 Body mass index (BMI) 35.0-35.9, adult: Secondary | ICD-10-CM | POA: Diagnosis not present

## 2016-02-10 DIAGNOSIS — E784 Other hyperlipidemia: Secondary | ICD-10-CM | POA: Diagnosis not present

## 2016-02-10 DIAGNOSIS — E119 Type 2 diabetes mellitus without complications: Secondary | ICD-10-CM | POA: Diagnosis not present

## 2016-02-10 DIAGNOSIS — I10 Essential (primary) hypertension: Secondary | ICD-10-CM | POA: Diagnosis not present

## 2016-02-10 DIAGNOSIS — E559 Vitamin D deficiency, unspecified: Secondary | ICD-10-CM | POA: Diagnosis not present

## 2016-02-10 DIAGNOSIS — Z1389 Encounter for screening for other disorder: Secondary | ICD-10-CM | POA: Diagnosis not present

## 2016-04-13 DIAGNOSIS — H52201 Unspecified astigmatism, right eye: Secondary | ICD-10-CM | POA: Diagnosis not present

## 2016-04-13 DIAGNOSIS — H25043 Posterior subcapsular polar age-related cataract, bilateral: Secondary | ICD-10-CM | POA: Diagnosis not present

## 2016-04-13 DIAGNOSIS — H2513 Age-related nuclear cataract, bilateral: Secondary | ICD-10-CM | POA: Diagnosis not present

## 2016-04-13 DIAGNOSIS — E119 Type 2 diabetes mellitus without complications: Secondary | ICD-10-CM | POA: Diagnosis not present

## 2016-05-09 DIAGNOSIS — R972 Elevated prostate specific antigen [PSA]: Secondary | ICD-10-CM | POA: Diagnosis not present

## 2016-05-16 DIAGNOSIS — R3121 Asymptomatic microscopic hematuria: Secondary | ICD-10-CM | POA: Diagnosis not present

## 2016-05-16 DIAGNOSIS — N400 Enlarged prostate without lower urinary tract symptoms: Secondary | ICD-10-CM | POA: Diagnosis not present

## 2016-06-01 IMAGING — US US ABDOMEN LIMITED
1 series · 14 of 25 positions shown · non-contrast
Comparison: CT of the abdomen and pelvis performed earlier today at
[DATE] a.m.

CLINICAL DATA: Obstructing stone at the distal common bile duct on
CT. Further evaluation requested. Initial encounter.

EXAM:
US ABDOMEN LIMITED - RIGHT UPPER QUADRANT

[Series 1: us abdomen limited · 0.30mm/px · 14 of 30 slices shown]
[im 1/30]
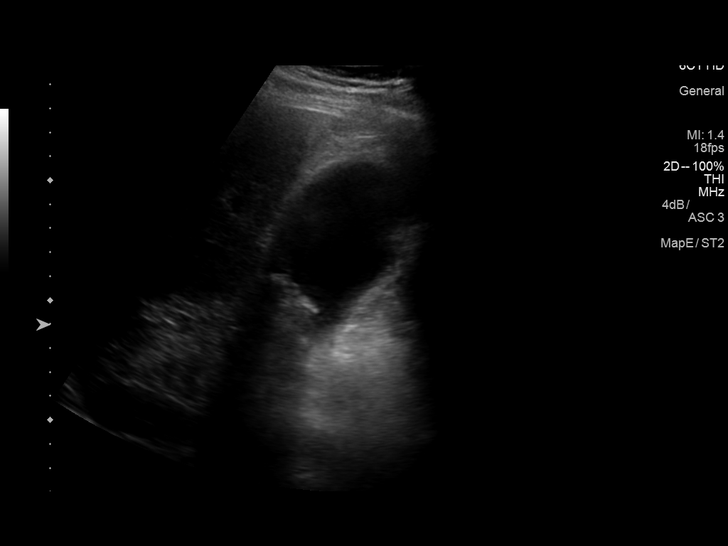
[im 3/30]
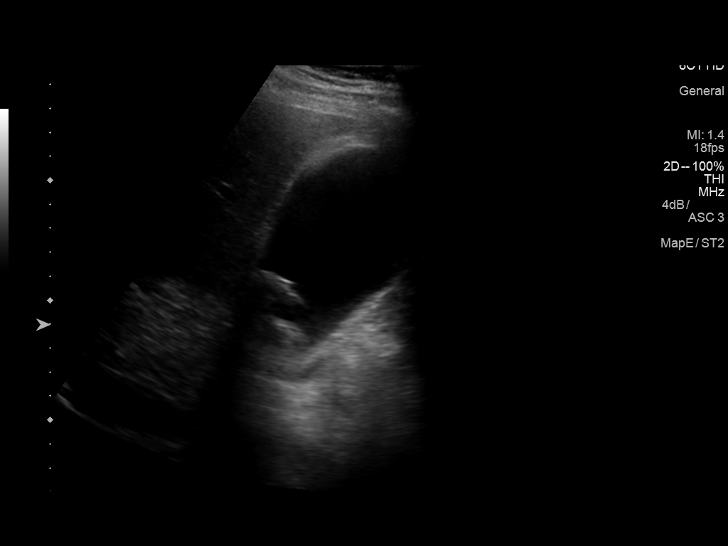
[im 5/30]
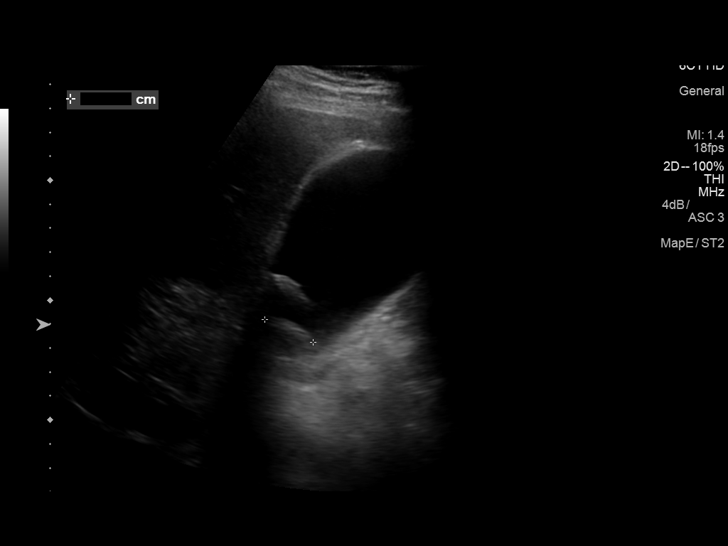
[im 8/30]
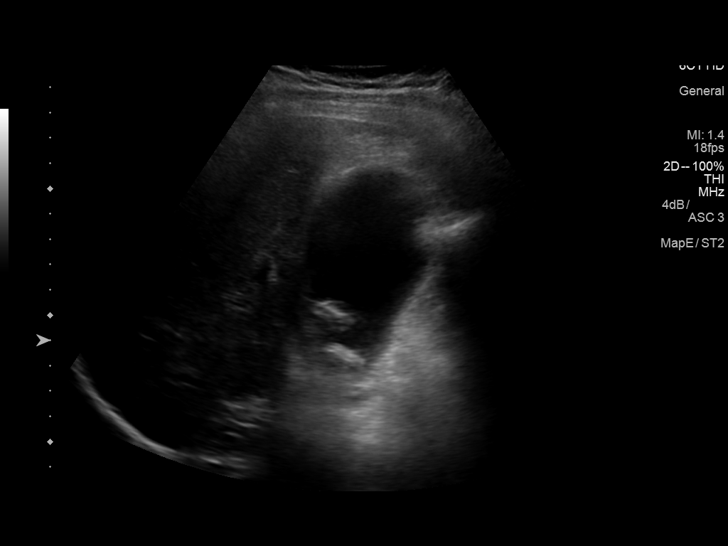
[im 10/30]
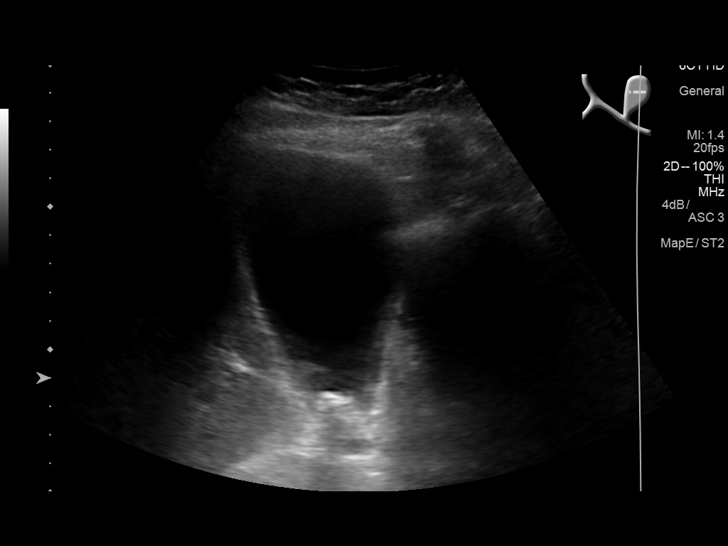
[im 11/30]
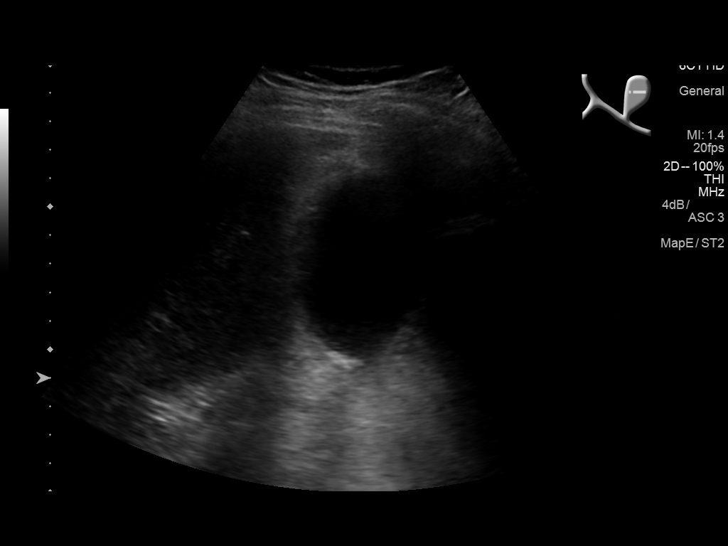
[im 14/30]
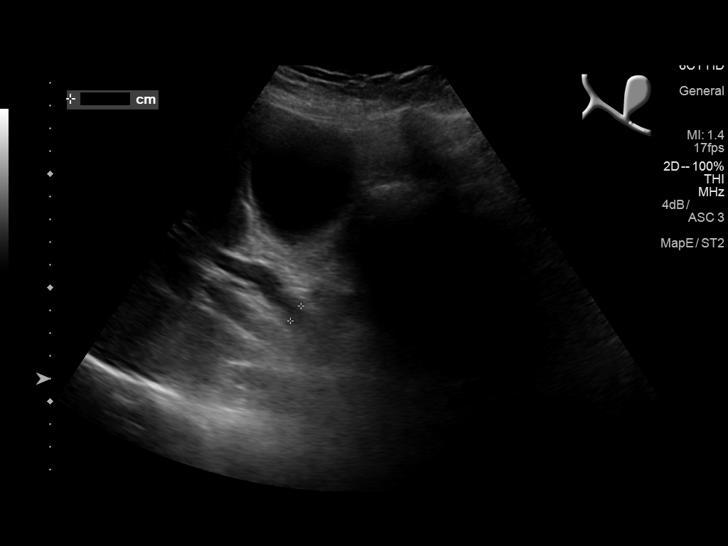
[im 16/30]
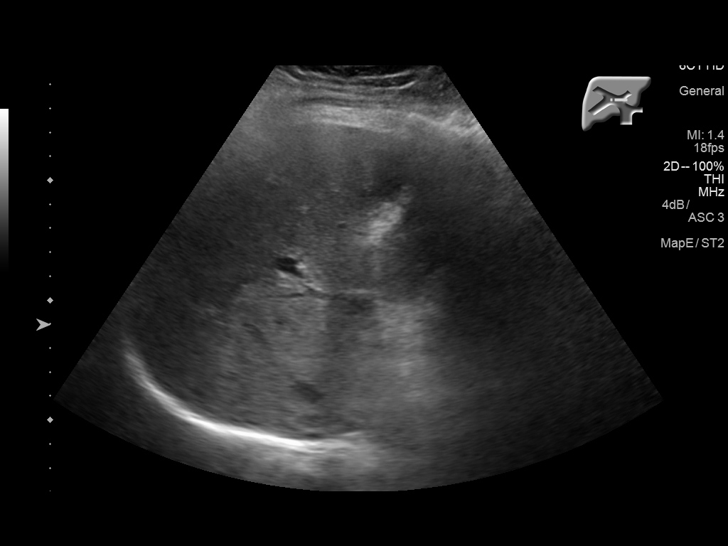
[im 19/30]
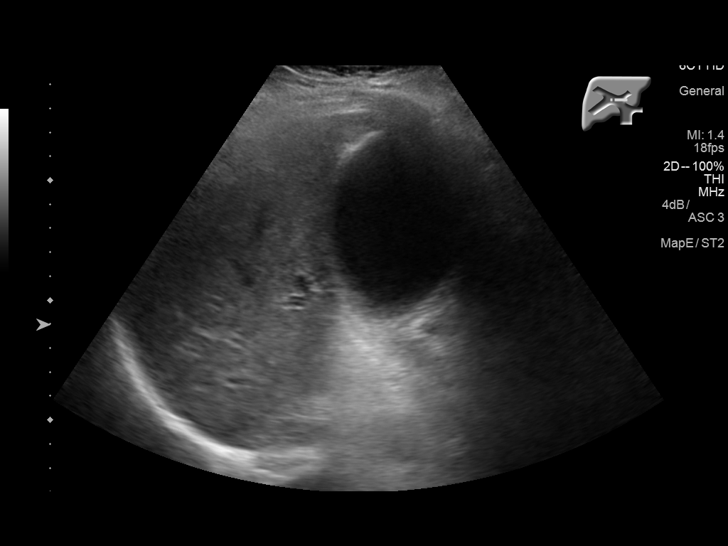
[im 20/30]
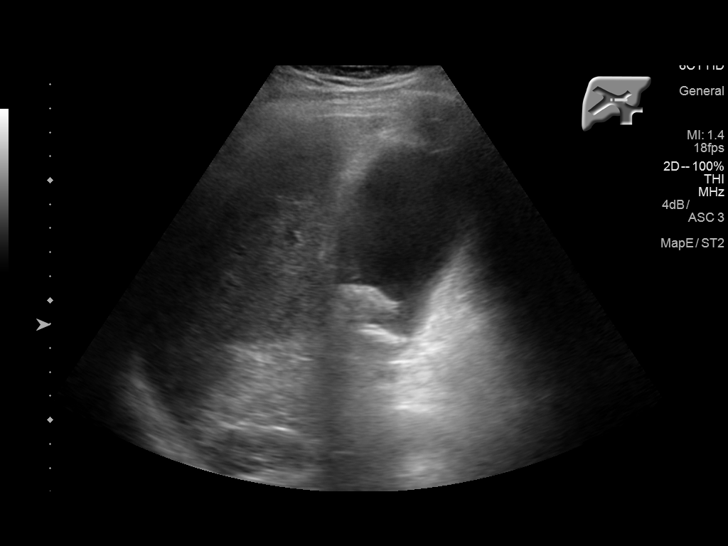
[im 22/30]
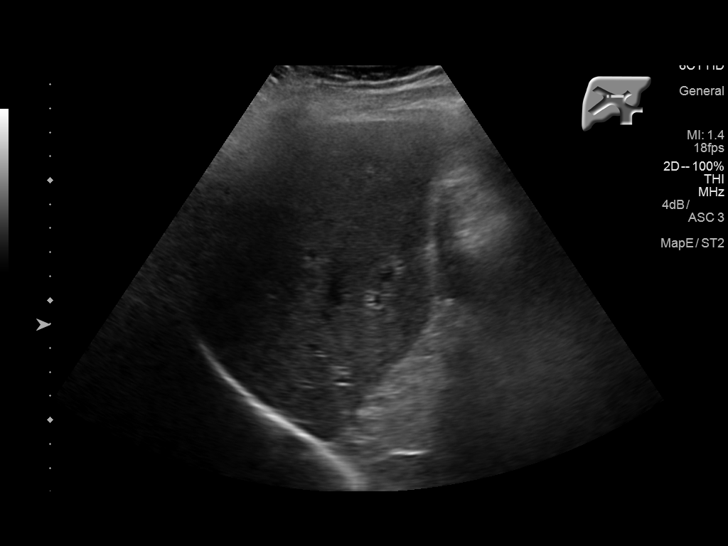
[im 25/30]
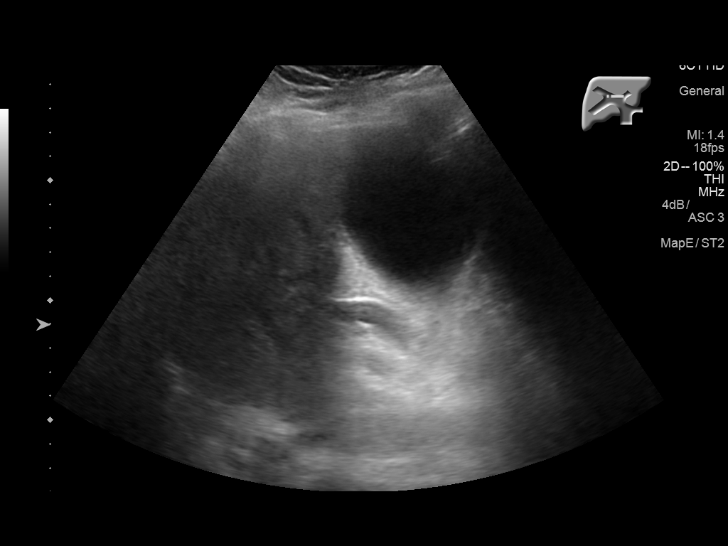
[im 27/30]
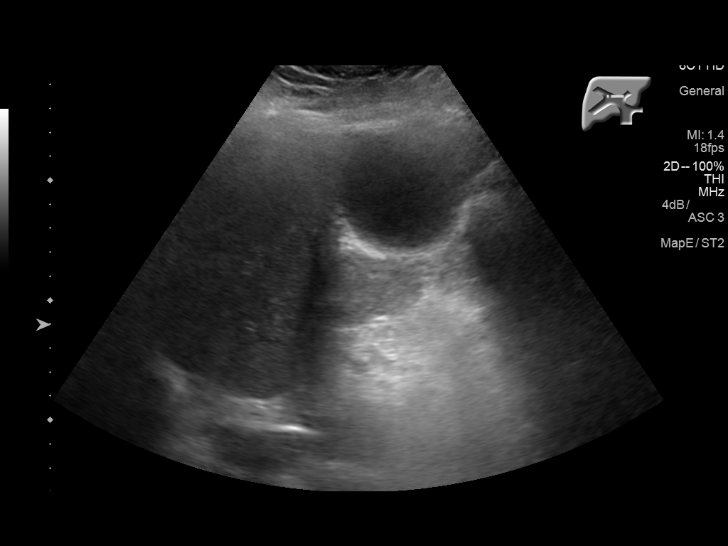
[im 30/30]
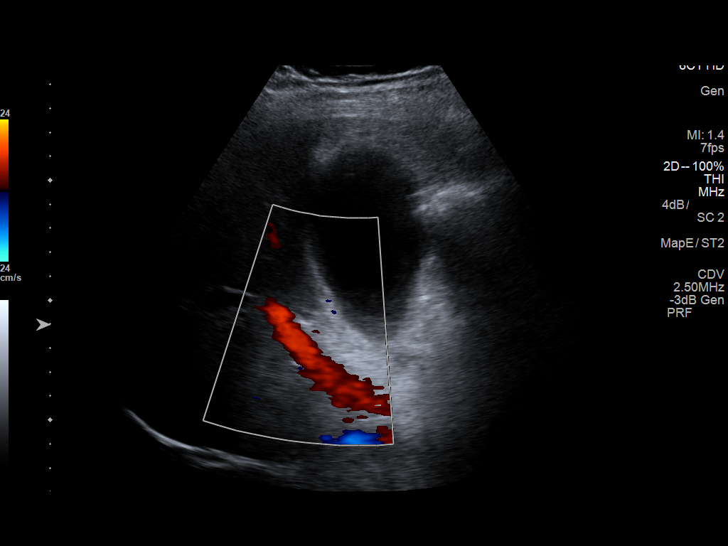

[14 of 25 positions shown; findings below may reference images not displayed]

FINDINGS: Gallbladder:

Stones are again noted within the gallbladder, measuring up to
cm in size. No definite gallbladder wall thickening or
pericholecystic fluid is seen. No ultrasonographic Murphy's sign is
elicited.

Common bile duct:

Diameter: 1.1 cm; dilated, as noted on CT, reflecting the distal
obstructing stone. The known obstructing stone is not seen on this
study due to overlying bowel gas.

Liver:

No focal lesion identified. Intrahepatic biliary duct dilatation is
better characterized on recent CT. Within normal limits in
parenchymal echogenicity.
IMPRESSION: 1. Dilatation of the common bile duct and intrahepatic biliary
ductal dilatation, reflecting the obstructing stone at the distal
common bile duct as seen on CT. The known obstructing stone is not
characterized on this study due to overlying bowel gas.
2. Underlying cholelithiasis.  No evidence for cholecystitis.

## 2016-06-14 DIAGNOSIS — N200 Calculus of kidney: Secondary | ICD-10-CM | POA: Diagnosis not present

## 2016-06-14 DIAGNOSIS — R945 Abnormal results of liver function studies: Secondary | ICD-10-CM | POA: Diagnosis not present

## 2016-06-14 DIAGNOSIS — R972 Elevated prostate specific antigen [PSA]: Secondary | ICD-10-CM | POA: Diagnosis not present

## 2016-06-14 DIAGNOSIS — E559 Vitamin D deficiency, unspecified: Secondary | ICD-10-CM | POA: Diagnosis not present

## 2016-06-14 DIAGNOSIS — E784 Other hyperlipidemia: Secondary | ICD-10-CM | POA: Diagnosis not present

## 2016-06-14 DIAGNOSIS — Z6836 Body mass index (BMI) 36.0-36.9, adult: Secondary | ICD-10-CM | POA: Diagnosis not present

## 2016-06-14 DIAGNOSIS — E119 Type 2 diabetes mellitus without complications: Secondary | ICD-10-CM | POA: Diagnosis not present

## 2016-06-14 DIAGNOSIS — Z23 Encounter for immunization: Secondary | ICD-10-CM | POA: Diagnosis not present

## 2016-06-14 DIAGNOSIS — I10 Essential (primary) hypertension: Secondary | ICD-10-CM | POA: Diagnosis not present

## 2016-06-23 DIAGNOSIS — Z85828 Personal history of other malignant neoplasm of skin: Secondary | ICD-10-CM | POA: Diagnosis not present

## 2016-06-23 DIAGNOSIS — D2371 Other benign neoplasm of skin of right lower limb, including hip: Secondary | ICD-10-CM | POA: Diagnosis not present

## 2016-06-23 DIAGNOSIS — D225 Melanocytic nevi of trunk: Secondary | ICD-10-CM | POA: Diagnosis not present

## 2016-06-23 DIAGNOSIS — L812 Freckles: Secondary | ICD-10-CM | POA: Diagnosis not present

## 2016-06-23 DIAGNOSIS — L821 Other seborrheic keratosis: Secondary | ICD-10-CM | POA: Diagnosis not present

## 2016-10-24 DIAGNOSIS — E559 Vitamin D deficiency, unspecified: Secondary | ICD-10-CM | POA: Diagnosis not present

## 2016-10-24 DIAGNOSIS — R972 Elevated prostate specific antigen [PSA]: Secondary | ICD-10-CM | POA: Diagnosis not present

## 2016-10-24 DIAGNOSIS — R945 Abnormal results of liver function studies: Secondary | ICD-10-CM | POA: Diagnosis not present

## 2016-10-24 DIAGNOSIS — M179 Osteoarthritis of knee, unspecified: Secondary | ICD-10-CM | POA: Diagnosis not present

## 2016-10-24 DIAGNOSIS — E119 Type 2 diabetes mellitus without complications: Secondary | ICD-10-CM | POA: Diagnosis not present

## 2016-10-24 DIAGNOSIS — E784 Other hyperlipidemia: Secondary | ICD-10-CM | POA: Diagnosis not present

## 2016-10-24 DIAGNOSIS — I10 Essential (primary) hypertension: Secondary | ICD-10-CM | POA: Diagnosis not present

## 2016-10-24 DIAGNOSIS — Z6837 Body mass index (BMI) 37.0-37.9, adult: Secondary | ICD-10-CM | POA: Diagnosis not present

## 2017-03-12 DIAGNOSIS — E119 Type 2 diabetes mellitus without complications: Secondary | ICD-10-CM | POA: Diagnosis not present

## 2017-03-12 DIAGNOSIS — I10 Essential (primary) hypertension: Secondary | ICD-10-CM | POA: Diagnosis not present

## 2017-03-12 DIAGNOSIS — E784 Other hyperlipidemia: Secondary | ICD-10-CM | POA: Diagnosis not present

## 2017-03-12 DIAGNOSIS — Z6838 Body mass index (BMI) 38.0-38.9, adult: Secondary | ICD-10-CM | POA: Diagnosis not present

## 2017-03-12 DIAGNOSIS — Z1389 Encounter for screening for other disorder: Secondary | ICD-10-CM | POA: Diagnosis not present

## 2017-03-12 DIAGNOSIS — M179 Osteoarthritis of knee, unspecified: Secondary | ICD-10-CM | POA: Diagnosis not present

## 2017-04-27 DIAGNOSIS — R972 Elevated prostate specific antigen [PSA]: Secondary | ICD-10-CM | POA: Diagnosis not present

## 2017-05-08 DIAGNOSIS — Z125 Encounter for screening for malignant neoplasm of prostate: Secondary | ICD-10-CM | POA: Diagnosis not present

## 2017-05-08 DIAGNOSIS — N5201 Erectile dysfunction due to arterial insufficiency: Secondary | ICD-10-CM | POA: Diagnosis not present

## 2017-05-08 DIAGNOSIS — N400 Enlarged prostate without lower urinary tract symptoms: Secondary | ICD-10-CM | POA: Diagnosis not present

## 2017-05-24 DIAGNOSIS — H52203 Unspecified astigmatism, bilateral: Secondary | ICD-10-CM | POA: Diagnosis not present

## 2017-05-24 DIAGNOSIS — H2513 Age-related nuclear cataract, bilateral: Secondary | ICD-10-CM | POA: Diagnosis not present

## 2017-05-24 DIAGNOSIS — H25043 Posterior subcapsular polar age-related cataract, bilateral: Secondary | ICD-10-CM | POA: Diagnosis not present

## 2017-05-24 DIAGNOSIS — E119 Type 2 diabetes mellitus without complications: Secondary | ICD-10-CM | POA: Diagnosis not present

## 2017-06-05 DIAGNOSIS — Z85828 Personal history of other malignant neoplasm of skin: Secondary | ICD-10-CM | POA: Diagnosis not present

## 2017-06-05 DIAGNOSIS — L720 Epidermal cyst: Secondary | ICD-10-CM | POA: Diagnosis not present

## 2017-07-27 DIAGNOSIS — E119 Type 2 diabetes mellitus without complications: Secondary | ICD-10-CM | POA: Diagnosis not present

## 2017-07-27 DIAGNOSIS — R945 Abnormal results of liver function studies: Secondary | ICD-10-CM | POA: Diagnosis not present

## 2017-07-27 DIAGNOSIS — Z6838 Body mass index (BMI) 38.0-38.9, adult: Secondary | ICD-10-CM | POA: Diagnosis not present

## 2017-07-27 DIAGNOSIS — Z23 Encounter for immunization: Secondary | ICD-10-CM | POA: Diagnosis not present

## 2017-07-27 DIAGNOSIS — E7849 Other hyperlipidemia: Secondary | ICD-10-CM | POA: Diagnosis not present

## 2017-07-27 DIAGNOSIS — R972 Elevated prostate specific antigen [PSA]: Secondary | ICD-10-CM | POA: Diagnosis not present

## 2017-07-27 DIAGNOSIS — M179 Osteoarthritis of knee, unspecified: Secondary | ICD-10-CM | POA: Diagnosis not present

## 2017-07-27 DIAGNOSIS — N209 Urinary calculus, unspecified: Secondary | ICD-10-CM | POA: Diagnosis not present

## 2017-07-27 DIAGNOSIS — E559 Vitamin D deficiency, unspecified: Secondary | ICD-10-CM | POA: Diagnosis not present

## 2017-11-23 DIAGNOSIS — R945 Abnormal results of liver function studies: Secondary | ICD-10-CM | POA: Diagnosis not present

## 2017-11-23 DIAGNOSIS — E559 Vitamin D deficiency, unspecified: Secondary | ICD-10-CM | POA: Diagnosis not present

## 2017-11-23 DIAGNOSIS — I10 Essential (primary) hypertension: Secondary | ICD-10-CM | POA: Diagnosis not present

## 2017-11-23 DIAGNOSIS — E119 Type 2 diabetes mellitus without complications: Secondary | ICD-10-CM | POA: Diagnosis not present

## 2017-11-23 DIAGNOSIS — Z6837 Body mass index (BMI) 37.0-37.9, adult: Secondary | ICD-10-CM | POA: Diagnosis not present

## 2017-11-23 DIAGNOSIS — N401 Enlarged prostate with lower urinary tract symptoms: Secondary | ICD-10-CM | POA: Diagnosis not present

## 2017-11-23 DIAGNOSIS — M179 Osteoarthritis of knee, unspecified: Secondary | ICD-10-CM | POA: Diagnosis not present

## 2017-11-23 DIAGNOSIS — N200 Calculus of kidney: Secondary | ICD-10-CM | POA: Diagnosis not present

## 2017-11-23 DIAGNOSIS — E7849 Other hyperlipidemia: Secondary | ICD-10-CM | POA: Diagnosis not present

## 2018-03-25 DIAGNOSIS — E7849 Other hyperlipidemia: Secondary | ICD-10-CM | POA: Diagnosis not present

## 2018-03-25 DIAGNOSIS — E119 Type 2 diabetes mellitus without complications: Secondary | ICD-10-CM | POA: Diagnosis not present

## 2018-03-25 DIAGNOSIS — I10 Essential (primary) hypertension: Secondary | ICD-10-CM | POA: Diagnosis not present

## 2018-03-25 DIAGNOSIS — N200 Calculus of kidney: Secondary | ICD-10-CM | POA: Diagnosis not present

## 2018-03-25 DIAGNOSIS — N401 Enlarged prostate with lower urinary tract symptoms: Secondary | ICD-10-CM | POA: Diagnosis not present

## 2018-03-25 DIAGNOSIS — E559 Vitamin D deficiency, unspecified: Secondary | ICD-10-CM | POA: Diagnosis not present

## 2018-03-25 DIAGNOSIS — M179 Osteoarthritis of knee, unspecified: Secondary | ICD-10-CM | POA: Diagnosis not present

## 2018-03-25 DIAGNOSIS — Z6838 Body mass index (BMI) 38.0-38.9, adult: Secondary | ICD-10-CM | POA: Diagnosis not present

## 2018-03-25 DIAGNOSIS — R945 Abnormal results of liver function studies: Secondary | ICD-10-CM | POA: Diagnosis not present

## 2018-05-06 DIAGNOSIS — R972 Elevated prostate specific antigen [PSA]: Secondary | ICD-10-CM | POA: Diagnosis not present

## 2018-05-14 DIAGNOSIS — N400 Enlarged prostate without lower urinary tract symptoms: Secondary | ICD-10-CM | POA: Diagnosis not present

## 2018-05-14 DIAGNOSIS — N5201 Erectile dysfunction due to arterial insufficiency: Secondary | ICD-10-CM | POA: Diagnosis not present

## 2018-05-30 DIAGNOSIS — E119 Type 2 diabetes mellitus without complications: Secondary | ICD-10-CM | POA: Diagnosis not present

## 2018-05-30 DIAGNOSIS — H524 Presbyopia: Secondary | ICD-10-CM | POA: Diagnosis not present

## 2018-05-30 DIAGNOSIS — H52203 Unspecified astigmatism, bilateral: Secondary | ICD-10-CM | POA: Diagnosis not present

## 2018-07-06 DIAGNOSIS — Z23 Encounter for immunization: Secondary | ICD-10-CM | POA: Diagnosis not present

## 2018-07-24 DIAGNOSIS — E1129 Type 2 diabetes mellitus with other diabetic kidney complication: Secondary | ICD-10-CM | POA: Diagnosis not present

## 2018-07-24 DIAGNOSIS — Z6831 Body mass index (BMI) 31.0-31.9, adult: Secondary | ICD-10-CM | POA: Diagnosis not present

## 2018-07-24 DIAGNOSIS — N183 Chronic kidney disease, stage 3 (moderate): Secondary | ICD-10-CM | POA: Diagnosis not present

## 2018-07-24 DIAGNOSIS — E559 Vitamin D deficiency, unspecified: Secondary | ICD-10-CM | POA: Diagnosis not present

## 2018-07-24 DIAGNOSIS — I10 Essential (primary) hypertension: Secondary | ICD-10-CM | POA: Diagnosis not present

## 2018-07-24 DIAGNOSIS — M179 Osteoarthritis of knee, unspecified: Secondary | ICD-10-CM | POA: Diagnosis not present

## 2018-07-24 DIAGNOSIS — N200 Calculus of kidney: Secondary | ICD-10-CM | POA: Diagnosis not present

## 2018-07-24 DIAGNOSIS — N401 Enlarged prostate with lower urinary tract symptoms: Secondary | ICD-10-CM | POA: Diagnosis not present

## 2018-07-24 DIAGNOSIS — E7849 Other hyperlipidemia: Secondary | ICD-10-CM | POA: Diagnosis not present

## 2018-12-12 DIAGNOSIS — E1129 Type 2 diabetes mellitus with other diabetic kidney complication: Secondary | ICD-10-CM | POA: Diagnosis not present

## 2018-12-13 DIAGNOSIS — E559 Vitamin D deficiency, unspecified: Secondary | ICD-10-CM | POA: Diagnosis not present

## 2018-12-13 DIAGNOSIS — E7849 Other hyperlipidemia: Secondary | ICD-10-CM | POA: Diagnosis not present

## 2018-12-16 DIAGNOSIS — E669 Obesity, unspecified: Secondary | ICD-10-CM | POA: Diagnosis not present

## 2018-12-16 DIAGNOSIS — E785 Hyperlipidemia, unspecified: Secondary | ICD-10-CM | POA: Diagnosis not present

## 2018-12-16 DIAGNOSIS — E1129 Type 2 diabetes mellitus with other diabetic kidney complication: Secondary | ICD-10-CM | POA: Diagnosis not present

## 2018-12-16 DIAGNOSIS — N401 Enlarged prostate with lower urinary tract symptoms: Secondary | ICD-10-CM | POA: Diagnosis not present

## 2018-12-16 DIAGNOSIS — E559 Vitamin D deficiency, unspecified: Secondary | ICD-10-CM | POA: Diagnosis not present

## 2018-12-16 DIAGNOSIS — M179 Osteoarthritis of knee, unspecified: Secondary | ICD-10-CM | POA: Diagnosis not present

## 2018-12-16 DIAGNOSIS — R945 Abnormal results of liver function studies: Secondary | ICD-10-CM | POA: Diagnosis not present

## 2018-12-16 DIAGNOSIS — N183 Chronic kidney disease, stage 3 (moderate): Secondary | ICD-10-CM | POA: Diagnosis not present

## 2018-12-16 DIAGNOSIS — I129 Hypertensive chronic kidney disease with stage 1 through stage 4 chronic kidney disease, or unspecified chronic kidney disease: Secondary | ICD-10-CM | POA: Diagnosis not present

## 2018-12-16 DIAGNOSIS — N200 Calculus of kidney: Secondary | ICD-10-CM | POA: Diagnosis not present

## 2019-04-10 DIAGNOSIS — M179 Osteoarthritis of knee, unspecified: Secondary | ICD-10-CM | POA: Diagnosis not present

## 2019-04-10 DIAGNOSIS — N183 Chronic kidney disease, stage 3 (moderate): Secondary | ICD-10-CM | POA: Diagnosis not present

## 2019-04-10 DIAGNOSIS — I129 Hypertensive chronic kidney disease with stage 1 through stage 4 chronic kidney disease, or unspecified chronic kidney disease: Secondary | ICD-10-CM | POA: Diagnosis not present

## 2019-04-10 DIAGNOSIS — E785 Hyperlipidemia, unspecified: Secondary | ICD-10-CM | POA: Diagnosis not present

## 2019-04-10 DIAGNOSIS — N200 Calculus of kidney: Secondary | ICD-10-CM | POA: Diagnosis not present

## 2019-04-10 DIAGNOSIS — R972 Elevated prostate specific antigen [PSA]: Secondary | ICD-10-CM | POA: Diagnosis not present

## 2019-04-10 DIAGNOSIS — E559 Vitamin D deficiency, unspecified: Secondary | ICD-10-CM | POA: Diagnosis not present

## 2019-04-10 DIAGNOSIS — E1129 Type 2 diabetes mellitus with other diabetic kidney complication: Secondary | ICD-10-CM | POA: Diagnosis not present

## 2019-04-18 DIAGNOSIS — Z23 Encounter for immunization: Secondary | ICD-10-CM | POA: Diagnosis not present

## 2019-05-20 DIAGNOSIS — N400 Enlarged prostate without lower urinary tract symptoms: Secondary | ICD-10-CM | POA: Diagnosis not present

## 2019-05-20 DIAGNOSIS — N5201 Erectile dysfunction due to arterial insufficiency: Secondary | ICD-10-CM | POA: Diagnosis not present

## 2019-06-10 DIAGNOSIS — Z85828 Personal history of other malignant neoplasm of skin: Secondary | ICD-10-CM | POA: Diagnosis not present

## 2019-06-10 DIAGNOSIS — D485 Neoplasm of uncertain behavior of skin: Secondary | ICD-10-CM | POA: Diagnosis not present

## 2019-06-10 DIAGNOSIS — C44319 Basal cell carcinoma of skin of other parts of face: Secondary | ICD-10-CM | POA: Diagnosis not present

## 2019-06-12 DIAGNOSIS — H5201 Hypermetropia, right eye: Secondary | ICD-10-CM | POA: Diagnosis not present

## 2019-06-12 DIAGNOSIS — H25043 Posterior subcapsular polar age-related cataract, bilateral: Secondary | ICD-10-CM | POA: Diagnosis not present

## 2019-06-12 DIAGNOSIS — E119 Type 2 diabetes mellitus without complications: Secondary | ICD-10-CM | POA: Diagnosis not present

## 2019-07-08 DIAGNOSIS — Z85828 Personal history of other malignant neoplasm of skin: Secondary | ICD-10-CM | POA: Diagnosis not present

## 2019-07-08 DIAGNOSIS — C44319 Basal cell carcinoma of skin of other parts of face: Secondary | ICD-10-CM | POA: Diagnosis not present

## 2019-09-15 ENCOUNTER — Ambulatory Visit: Payer: Medicare Other | Attending: Internal Medicine

## 2019-09-15 DIAGNOSIS — Z23 Encounter for immunization: Secondary | ICD-10-CM | POA: Insufficient documentation

## 2019-09-15 NOTE — Progress Notes (Signed)
   Covid-19 Vaccination Clinic  Name:  Gary Roach    MRN: [id number] DOB: 08/19/1941  09/15/2019  Mr. Gary Roach was observed post Covid-19 immunization for 15 minutes without incidence. He was provided with Vaccine Information Sheet and instruction to access the V-Safe system.   Mr. Gary Roach was instructed to call 911 with any severe reactions post vaccine: Marland Kitchen Difficulty breathing  . Swelling of your face and throat  . A fast heartbeat  . A bad rash all over your body  . Dizziness and weakness    Immunizations Administered    Name Date Dose VIS Date Route   Pfizer COVID-19 Vaccine 09/15/2019 12:42 PM 0.3 mL 08/01/2019 Intramuscular   Manufacturer: [place]   Lot: [id number]   [place]: [id number]

## 2019-09-22 DEATH — deceased

## 2019-10-07 ENCOUNTER — Ambulatory Visit: Payer: PRIVATE HEALTH INSURANCE

## 2019-10-10 ENCOUNTER — Ambulatory Visit: Payer: PRIVATE HEALTH INSURANCE

## 2019-10-12 ENCOUNTER — Ambulatory Visit: Payer: Medicare Other | Attending: Internal Medicine

## 2019-10-12 DIAGNOSIS — Z23 Encounter for immunization: Secondary | ICD-10-CM

## 2019-10-12 NOTE — Progress Notes (Signed)
   Covid-19 Vaccination Clinic  Name:  Gary Roach    MRN: [id number] DOB: Nov 18, 1940  10/12/2019  [name] was observed post Covid-19 immunization for 15 minutes without incidence. He was provided with Vaccine Information Sheet and instruction to access the V-Safe system.   [name] was instructed to call 911 with any severe reactions post vaccine: Marland Kitchen Difficulty breathing  . Swelling of your face and throat  . A fast heartbeat  . A bad rash all over your body  . Dizziness and weakness    Immunizations Administered    Name Date Dose VIS Date Route   Pfizer COVID-19 Vaccine 10/12/2019  9:42 AM 0.3 mL 08/01/2019 Intramuscular   Manufacturer: [place]   Lot: [id number]   [place]: [id number]

## 2019-12-11 DIAGNOSIS — N1831 Chronic kidney disease, stage 3a: Secondary | ICD-10-CM | POA: Diagnosis not present

## 2019-12-11 DIAGNOSIS — R945 Abnormal results of liver function studies: Secondary | ICD-10-CM | POA: Diagnosis not present

## 2019-12-11 DIAGNOSIS — E669 Obesity, unspecified: Secondary | ICD-10-CM | POA: Diagnosis not present

## 2019-12-11 DIAGNOSIS — N401 Enlarged prostate with lower urinary tract symptoms: Secondary | ICD-10-CM | POA: Diagnosis not present

## 2019-12-11 DIAGNOSIS — M179 Osteoarthritis of knee, unspecified: Secondary | ICD-10-CM | POA: Diagnosis not present

## 2019-12-11 DIAGNOSIS — E7849 Other hyperlipidemia: Secondary | ICD-10-CM | POA: Diagnosis not present

## 2019-12-11 DIAGNOSIS — K219 Gastro-esophageal reflux disease without esophagitis: Secondary | ICD-10-CM | POA: Diagnosis not present

## 2019-12-11 DIAGNOSIS — I10 Essential (primary) hypertension: Secondary | ICD-10-CM | POA: Diagnosis not present

## 2019-12-11 DIAGNOSIS — E559 Vitamin D deficiency, unspecified: Secondary | ICD-10-CM | POA: Diagnosis not present

## 2019-12-11 DIAGNOSIS — E1129 Type 2 diabetes mellitus with other diabetic kidney complication: Secondary | ICD-10-CM | POA: Diagnosis not present

## 2020-01-13 DIAGNOSIS — D485 Neoplasm of uncertain behavior of skin: Secondary | ICD-10-CM | POA: Diagnosis not present

## 2020-01-13 DIAGNOSIS — L812 Freckles: Secondary | ICD-10-CM | POA: Diagnosis not present

## 2020-01-13 DIAGNOSIS — Z85828 Personal history of other malignant neoplasm of skin: Secondary | ICD-10-CM | POA: Diagnosis not present

## 2020-01-13 DIAGNOSIS — D1801 Hemangioma of skin and subcutaneous tissue: Secondary | ICD-10-CM | POA: Diagnosis not present

## 2020-01-13 DIAGNOSIS — C44319 Basal cell carcinoma of skin of other parts of face: Secondary | ICD-10-CM | POA: Diagnosis not present

## 2020-04-22 DIAGNOSIS — D485 Neoplasm of uncertain behavior of skin: Secondary | ICD-10-CM | POA: Diagnosis not present

## 2020-04-22 DIAGNOSIS — C4331 Malignant melanoma of nose: Secondary | ICD-10-CM | POA: Diagnosis not present

## 2020-04-22 DIAGNOSIS — Z85828 Personal history of other malignant neoplasm of skin: Secondary | ICD-10-CM | POA: Diagnosis not present

## 2020-05-04 DIAGNOSIS — R972 Elevated prostate specific antigen [PSA]: Secondary | ICD-10-CM | POA: Diagnosis not present

## 2020-05-06 DIAGNOSIS — C4331 Malignant melanoma of nose: Secondary | ICD-10-CM | POA: Diagnosis not present

## 2020-05-07 DIAGNOSIS — D485 Neoplasm of uncertain behavior of skin: Secondary | ICD-10-CM | POA: Diagnosis not present

## 2020-05-11 DIAGNOSIS — N3000 Acute cystitis without hematuria: Secondary | ICD-10-CM | POA: Diagnosis not present

## 2020-05-11 DIAGNOSIS — N5201 Erectile dysfunction due to arterial insufficiency: Secondary | ICD-10-CM | POA: Diagnosis not present

## 2020-05-11 DIAGNOSIS — N400 Enlarged prostate without lower urinary tract symptoms: Secondary | ICD-10-CM | POA: Diagnosis not present

## 2020-05-17 DIAGNOSIS — Z85820 Personal history of malignant melanoma of skin: Secondary | ICD-10-CM | POA: Diagnosis not present

## 2020-05-17 DIAGNOSIS — D2239 Melanocytic nevi of other parts of face: Secondary | ICD-10-CM | POA: Diagnosis not present

## 2020-05-17 DIAGNOSIS — Z85828 Personal history of other malignant neoplasm of skin: Secondary | ICD-10-CM | POA: Diagnosis not present

## 2020-05-27 DIAGNOSIS — R350 Frequency of micturition: Secondary | ICD-10-CM | POA: Diagnosis not present

## 2020-05-27 DIAGNOSIS — N401 Enlarged prostate with lower urinary tract symptoms: Secondary | ICD-10-CM | POA: Diagnosis not present

## 2020-05-27 DIAGNOSIS — R351 Nocturia: Secondary | ICD-10-CM | POA: Diagnosis not present

## 2020-05-27 DIAGNOSIS — R8279 Other abnormal findings on microbiological examination of urine: Secondary | ICD-10-CM | POA: Diagnosis not present

## 2020-06-02 DIAGNOSIS — D485 Neoplasm of uncertain behavior of skin: Secondary | ICD-10-CM | POA: Diagnosis not present

## 2020-06-02 DIAGNOSIS — L812 Freckles: Secondary | ICD-10-CM | POA: Diagnosis not present

## 2020-06-02 DIAGNOSIS — Z85820 Personal history of malignant melanoma of skin: Secondary | ICD-10-CM | POA: Diagnosis not present

## 2020-06-02 DIAGNOSIS — D1801 Hemangioma of skin and subcutaneous tissue: Secondary | ICD-10-CM | POA: Diagnosis not present

## 2020-06-02 DIAGNOSIS — Z85828 Personal history of other malignant neoplasm of skin: Secondary | ICD-10-CM | POA: Diagnosis not present

## 2020-06-02 DIAGNOSIS — D225 Melanocytic nevi of trunk: Secondary | ICD-10-CM | POA: Diagnosis not present

## 2020-06-05 DIAGNOSIS — Z23 Encounter for immunization: Secondary | ICD-10-CM | POA: Diagnosis not present

## 2020-06-08 DIAGNOSIS — Z20822 Contact with and (suspected) exposure to covid-19: Secondary | ICD-10-CM | POA: Diagnosis not present

## 2020-06-11 DIAGNOSIS — M170 Bilateral primary osteoarthritis of knee: Secondary | ICD-10-CM | POA: Diagnosis not present

## 2020-06-11 DIAGNOSIS — Z6834 Body mass index (BMI) 34.0-34.9, adult: Secondary | ICD-10-CM | POA: Diagnosis not present

## 2020-06-11 DIAGNOSIS — E785 Hyperlipidemia, unspecified: Secondary | ICD-10-CM | POA: Diagnosis not present

## 2020-06-11 DIAGNOSIS — C4331 Malignant melanoma of nose: Secondary | ICD-10-CM | POA: Diagnosis not present

## 2020-06-11 DIAGNOSIS — D0339 Melanoma in situ of other parts of face: Secondary | ICD-10-CM | POA: Diagnosis not present

## 2020-06-11 DIAGNOSIS — D2339 Other benign neoplasm of skin of other parts of face: Secondary | ICD-10-CM | POA: Diagnosis not present

## 2020-06-11 DIAGNOSIS — I10 Essential (primary) hypertension: Secondary | ICD-10-CM | POA: Diagnosis not present

## 2020-06-11 DIAGNOSIS — E119 Type 2 diabetes mellitus without complications: Secondary | ICD-10-CM | POA: Diagnosis not present

## 2020-06-11 DIAGNOSIS — Z85828 Personal history of other malignant neoplasm of skin: Secondary | ICD-10-CM | POA: Diagnosis not present

## 2020-06-11 DIAGNOSIS — Z9049 Acquired absence of other specified parts of digestive tract: Secondary | ICD-10-CM | POA: Diagnosis not present

## 2020-06-11 DIAGNOSIS — E669 Obesity, unspecified: Secondary | ICD-10-CM | POA: Diagnosis not present

## 2020-06-11 DIAGNOSIS — Z79899 Other long term (current) drug therapy: Secondary | ICD-10-CM | POA: Diagnosis not present

## 2020-06-11 DIAGNOSIS — N400 Enlarged prostate without lower urinary tract symptoms: Secondary | ICD-10-CM | POA: Diagnosis not present

## 2020-06-11 DIAGNOSIS — K219 Gastro-esophageal reflux disease without esophagitis: Secondary | ICD-10-CM | POA: Diagnosis not present

## 2020-06-18 DIAGNOSIS — C4331 Malignant melanoma of nose: Secondary | ICD-10-CM | POA: Diagnosis not present

## 2020-06-24 DIAGNOSIS — C4331 Malignant melanoma of nose: Secondary | ICD-10-CM | POA: Diagnosis not present

## 2020-06-29 DIAGNOSIS — N401 Enlarged prostate with lower urinary tract symptoms: Secondary | ICD-10-CM | POA: Diagnosis not present

## 2020-06-29 DIAGNOSIS — N3000 Acute cystitis without hematuria: Secondary | ICD-10-CM | POA: Diagnosis not present

## 2020-06-29 DIAGNOSIS — R350 Frequency of micturition: Secondary | ICD-10-CM | POA: Diagnosis not present

## 2020-06-30 DIAGNOSIS — Z23 Encounter for immunization: Secondary | ICD-10-CM | POA: Diagnosis not present

## 2020-07-02 DIAGNOSIS — C4331 Malignant melanoma of nose: Secondary | ICD-10-CM | POA: Diagnosis not present

## 2020-07-20 DIAGNOSIS — Z85820 Personal history of malignant melanoma of skin: Secondary | ICD-10-CM | POA: Diagnosis not present

## 2020-07-20 DIAGNOSIS — L820 Inflamed seborrheic keratosis: Secondary | ICD-10-CM | POA: Diagnosis not present

## 2020-07-20 DIAGNOSIS — Z85828 Personal history of other malignant neoplasm of skin: Secondary | ICD-10-CM | POA: Diagnosis not present

## 2020-07-20 DIAGNOSIS — D1801 Hemangioma of skin and subcutaneous tissue: Secondary | ICD-10-CM | POA: Diagnosis not present

## 2020-07-21 DIAGNOSIS — I129 Hypertensive chronic kidney disease with stage 1 through stage 4 chronic kidney disease, or unspecified chronic kidney disease: Secondary | ICD-10-CM | POA: Diagnosis not present

## 2020-07-21 DIAGNOSIS — K219 Gastro-esophageal reflux disease without esophagitis: Secondary | ICD-10-CM | POA: Diagnosis not present

## 2020-07-21 DIAGNOSIS — E785 Hyperlipidemia, unspecified: Secondary | ICD-10-CM | POA: Diagnosis not present

## 2020-07-21 DIAGNOSIS — N1831 Chronic kidney disease, stage 3a: Secondary | ICD-10-CM | POA: Diagnosis not present

## 2020-07-21 DIAGNOSIS — M179 Osteoarthritis of knee, unspecified: Secondary | ICD-10-CM | POA: Diagnosis not present

## 2020-07-21 DIAGNOSIS — E669 Obesity, unspecified: Secondary | ICD-10-CM | POA: Diagnosis not present

## 2020-07-21 DIAGNOSIS — E1129 Type 2 diabetes mellitus with other diabetic kidney complication: Secondary | ICD-10-CM | POA: Diagnosis not present

## 2020-07-21 DIAGNOSIS — N200 Calculus of kidney: Secondary | ICD-10-CM | POA: Diagnosis not present

## 2020-09-02 DIAGNOSIS — D1801 Hemangioma of skin and subcutaneous tissue: Secondary | ICD-10-CM | POA: Diagnosis not present

## 2020-09-02 DIAGNOSIS — D225 Melanocytic nevi of trunk: Secondary | ICD-10-CM | POA: Diagnosis not present

## 2020-09-02 DIAGNOSIS — L812 Freckles: Secondary | ICD-10-CM | POA: Diagnosis not present

## 2020-09-02 DIAGNOSIS — L821 Other seborrheic keratosis: Secondary | ICD-10-CM | POA: Diagnosis not present

## 2020-09-02 DIAGNOSIS — Z85828 Personal history of other malignant neoplasm of skin: Secondary | ICD-10-CM | POA: Diagnosis not present

## 2020-09-02 DIAGNOSIS — Z85820 Personal history of malignant melanoma of skin: Secondary | ICD-10-CM | POA: Diagnosis not present

## 2020-10-20 DIAGNOSIS — E119 Type 2 diabetes mellitus without complications: Secondary | ICD-10-CM | POA: Diagnosis not present

## 2020-10-20 DIAGNOSIS — H25043 Posterior subcapsular polar age-related cataract, bilateral: Secondary | ICD-10-CM | POA: Diagnosis not present

## 2020-10-20 DIAGNOSIS — H2513 Age-related nuclear cataract, bilateral: Secondary | ICD-10-CM | POA: Diagnosis not present

## 2020-10-20 DIAGNOSIS — H524 Presbyopia: Secondary | ICD-10-CM | POA: Diagnosis not present

## 2020-12-01 DIAGNOSIS — L821 Other seborrheic keratosis: Secondary | ICD-10-CM | POA: Diagnosis not present

## 2020-12-01 DIAGNOSIS — Z85828 Personal history of other malignant neoplasm of skin: Secondary | ICD-10-CM | POA: Diagnosis not present

## 2020-12-01 DIAGNOSIS — Z85820 Personal history of malignant melanoma of skin: Secondary | ICD-10-CM | POA: Diagnosis not present

## 2020-12-01 DIAGNOSIS — D1801 Hemangioma of skin and subcutaneous tissue: Secondary | ICD-10-CM | POA: Diagnosis not present

## 2020-12-01 DIAGNOSIS — D225 Melanocytic nevi of trunk: Secondary | ICD-10-CM | POA: Diagnosis not present

## 2020-12-01 DIAGNOSIS — D2262 Melanocytic nevi of left upper limb, including shoulder: Secondary | ICD-10-CM | POA: Diagnosis not present

## 2021-01-06 DIAGNOSIS — Z23 Encounter for immunization: Secondary | ICD-10-CM | POA: Diagnosis not present

## 2021-01-20 DIAGNOSIS — M179 Osteoarthritis of knee, unspecified: Secondary | ICD-10-CM | POA: Diagnosis not present

## 2021-01-20 DIAGNOSIS — N1831 Chronic kidney disease, stage 3a: Secondary | ICD-10-CM | POA: Diagnosis not present

## 2021-01-20 DIAGNOSIS — I10 Essential (primary) hypertension: Secondary | ICD-10-CM | POA: Diagnosis not present

## 2021-01-20 DIAGNOSIS — E1129 Type 2 diabetes mellitus with other diabetic kidney complication: Secondary | ICD-10-CM | POA: Diagnosis not present

## 2021-01-20 DIAGNOSIS — E559 Vitamin D deficiency, unspecified: Secondary | ICD-10-CM | POA: Diagnosis not present

## 2021-01-20 DIAGNOSIS — E785 Hyperlipidemia, unspecified: Secondary | ICD-10-CM | POA: Diagnosis not present

## 2021-01-20 DIAGNOSIS — I129 Hypertensive chronic kidney disease with stage 1 through stage 4 chronic kidney disease, or unspecified chronic kidney disease: Secondary | ICD-10-CM | POA: Diagnosis not present

## 2021-01-20 DIAGNOSIS — F5221 Male erectile disorder: Secondary | ICD-10-CM | POA: Diagnosis not present

## 2021-01-20 DIAGNOSIS — N401 Enlarged prostate with lower urinary tract symptoms: Secondary | ICD-10-CM | POA: Diagnosis not present

## 2021-01-20 DIAGNOSIS — C4331 Malignant melanoma of nose: Secondary | ICD-10-CM | POA: Diagnosis not present

## 2021-01-20 DIAGNOSIS — K2980 Duodenitis without bleeding: Secondary | ICD-10-CM | POA: Diagnosis not present

## 2021-01-20 DIAGNOSIS — E669 Obesity, unspecified: Secondary | ICD-10-CM | POA: Diagnosis not present

## 2021-03-08 DIAGNOSIS — D2371 Other benign neoplasm of skin of right lower limb, including hip: Secondary | ICD-10-CM | POA: Diagnosis not present

## 2021-03-08 DIAGNOSIS — Z85820 Personal history of malignant melanoma of skin: Secondary | ICD-10-CM | POA: Diagnosis not present

## 2021-03-08 DIAGNOSIS — D225 Melanocytic nevi of trunk: Secondary | ICD-10-CM | POA: Diagnosis not present

## 2021-03-08 DIAGNOSIS — Z85828 Personal history of other malignant neoplasm of skin: Secondary | ICD-10-CM | POA: Diagnosis not present

## 2021-03-08 DIAGNOSIS — L821 Other seborrheic keratosis: Secondary | ICD-10-CM | POA: Diagnosis not present

## 2021-03-08 DIAGNOSIS — D1801 Hemangioma of skin and subcutaneous tissue: Secondary | ICD-10-CM | POA: Diagnosis not present

## 2021-03-08 DIAGNOSIS — L812 Freckles: Secondary | ICD-10-CM | POA: Diagnosis not present

## 2021-05-12 DIAGNOSIS — Z125 Encounter for screening for malignant neoplasm of prostate: Secondary | ICD-10-CM | POA: Diagnosis not present

## 2021-05-19 DIAGNOSIS — R350 Frequency of micturition: Secondary | ICD-10-CM | POA: Diagnosis not present

## 2021-05-19 DIAGNOSIS — N401 Enlarged prostate with lower urinary tract symptoms: Secondary | ICD-10-CM | POA: Diagnosis not present

## 2021-05-19 DIAGNOSIS — N5201 Erectile dysfunction due to arterial insufficiency: Secondary | ICD-10-CM | POA: Diagnosis not present

## 2021-06-09 DIAGNOSIS — L812 Freckles: Secondary | ICD-10-CM | POA: Diagnosis not present

## 2021-06-09 DIAGNOSIS — D1801 Hemangioma of skin and subcutaneous tissue: Secondary | ICD-10-CM | POA: Diagnosis not present

## 2021-06-09 DIAGNOSIS — Z85820 Personal history of malignant melanoma of skin: Secondary | ICD-10-CM | POA: Diagnosis not present

## 2021-06-09 DIAGNOSIS — L821 Other seborrheic keratosis: Secondary | ICD-10-CM | POA: Diagnosis not present

## 2021-06-09 DIAGNOSIS — D225 Melanocytic nevi of trunk: Secondary | ICD-10-CM | POA: Diagnosis not present

## 2021-06-09 DIAGNOSIS — Z85828 Personal history of other malignant neoplasm of skin: Secondary | ICD-10-CM | POA: Diagnosis not present

## 2021-06-11 DIAGNOSIS — Z23 Encounter for immunization: Secondary | ICD-10-CM | POA: Diagnosis not present

## 2021-06-15 DIAGNOSIS — Z23 Encounter for immunization: Secondary | ICD-10-CM | POA: Diagnosis not present

## 2021-06-16 DIAGNOSIS — K2980 Duodenitis without bleeding: Secondary | ICD-10-CM | POA: Diagnosis not present

## 2021-06-16 DIAGNOSIS — E559 Vitamin D deficiency, unspecified: Secondary | ICD-10-CM | POA: Diagnosis not present

## 2021-06-16 DIAGNOSIS — I129 Hypertensive chronic kidney disease with stage 1 through stage 4 chronic kidney disease, or unspecified chronic kidney disease: Secondary | ICD-10-CM | POA: Diagnosis not present

## 2021-06-16 DIAGNOSIS — I10 Essential (primary) hypertension: Secondary | ICD-10-CM | POA: Diagnosis not present

## 2021-06-16 DIAGNOSIS — N1831 Chronic kidney disease, stage 3a: Secondary | ICD-10-CM | POA: Diagnosis not present

## 2021-06-16 DIAGNOSIS — E669 Obesity, unspecified: Secondary | ICD-10-CM | POA: Diagnosis not present

## 2021-06-16 DIAGNOSIS — C4331 Malignant melanoma of nose: Secondary | ICD-10-CM | POA: Diagnosis not present

## 2021-06-16 DIAGNOSIS — N200 Calculus of kidney: Secondary | ICD-10-CM | POA: Diagnosis not present

## 2021-06-16 DIAGNOSIS — N401 Enlarged prostate with lower urinary tract symptoms: Secondary | ICD-10-CM | POA: Diagnosis not present

## 2021-06-16 DIAGNOSIS — E785 Hyperlipidemia, unspecified: Secondary | ICD-10-CM | POA: Diagnosis not present

## 2021-06-16 DIAGNOSIS — E1129 Type 2 diabetes mellitus with other diabetic kidney complication: Secondary | ICD-10-CM | POA: Diagnosis not present

## 2021-06-16 DIAGNOSIS — M179 Osteoarthritis of knee, unspecified: Secondary | ICD-10-CM | POA: Diagnosis not present

## 2021-10-07 DIAGNOSIS — E785 Hyperlipidemia, unspecified: Secondary | ICD-10-CM | POA: Diagnosis not present

## 2021-10-07 DIAGNOSIS — C4331 Malignant melanoma of nose: Secondary | ICD-10-CM | POA: Diagnosis not present

## 2021-10-07 DIAGNOSIS — I129 Hypertensive chronic kidney disease with stage 1 through stage 4 chronic kidney disease, or unspecified chronic kidney disease: Secondary | ICD-10-CM | POA: Diagnosis not present

## 2021-10-07 DIAGNOSIS — E669 Obesity, unspecified: Secondary | ICD-10-CM | POA: Diagnosis not present

## 2021-10-07 DIAGNOSIS — K219 Gastro-esophageal reflux disease without esophagitis: Secondary | ICD-10-CM | POA: Diagnosis not present

## 2021-10-07 DIAGNOSIS — E559 Vitamin D deficiency, unspecified: Secondary | ICD-10-CM | POA: Diagnosis not present

## 2021-10-07 DIAGNOSIS — N1831 Chronic kidney disease, stage 3a: Secondary | ICD-10-CM | POA: Diagnosis not present

## 2021-10-07 DIAGNOSIS — N401 Enlarged prostate with lower urinary tract symptoms: Secondary | ICD-10-CM | POA: Diagnosis not present

## 2021-10-07 DIAGNOSIS — M179 Osteoarthritis of knee, unspecified: Secondary | ICD-10-CM | POA: Diagnosis not present

## 2021-10-07 DIAGNOSIS — I10 Essential (primary) hypertension: Secondary | ICD-10-CM | POA: Diagnosis not present

## 2021-10-07 DIAGNOSIS — E1129 Type 2 diabetes mellitus with other diabetic kidney complication: Secondary | ICD-10-CM | POA: Diagnosis not present

## 2021-10-08 ENCOUNTER — Encounter (HOSPITAL_COMMUNITY): Payer: Self-pay | Admitting: Emergency Medicine

## 2021-10-08 ENCOUNTER — Other Ambulatory Visit: Payer: Self-pay

## 2021-10-08 ENCOUNTER — Emergency Department (HOSPITAL_COMMUNITY): Payer: Medicare Other

## 2021-10-08 ENCOUNTER — Emergency Department (HOSPITAL_COMMUNITY)
Admission: EM | Admit: 2021-10-08 | Discharge: 2021-10-09 | Disposition: A | Discharge status: Home / Self Care | Payer: Medicare Other | Attending: Emergency Medicine | Admitting: Emergency Medicine

## 2021-10-08 DIAGNOSIS — S0083XA Contusion of other part of head, initial encounter: Secondary | ICD-10-CM

## 2021-10-08 DIAGNOSIS — Z23 Encounter for immunization: Secondary | ICD-10-CM | POA: Insufficient documentation

## 2021-10-08 DIAGNOSIS — S0181XA Laceration without foreign body of other part of head, initial encounter: Secondary | ICD-10-CM | POA: Insufficient documentation

## 2021-10-08 DIAGNOSIS — Z7982 Long term (current) use of aspirin: Secondary | ICD-10-CM | POA: Insufficient documentation

## 2021-10-08 DIAGNOSIS — S01511A Laceration without foreign body of lip, initial encounter: Secondary | ICD-10-CM | POA: Insufficient documentation

## 2021-10-08 DIAGNOSIS — Z79899 Other long term (current) drug therapy: Secondary | ICD-10-CM | POA: Diagnosis not present

## 2021-10-08 DIAGNOSIS — Y92009 Unspecified place in unspecified non-institutional (private) residence as the place of occurrence of the external cause: Secondary | ICD-10-CM | POA: Diagnosis not present

## 2021-10-08 DIAGNOSIS — W01198A Fall on same level from slipping, tripping and stumbling with subsequent striking against other object, initial encounter: Secondary | ICD-10-CM | POA: Insufficient documentation

## 2021-10-08 DIAGNOSIS — W19XXXA Unspecified fall, initial encounter: Secondary | ICD-10-CM

## 2021-10-08 DIAGNOSIS — S00501A Unspecified superficial injury of lip, initial encounter: Secondary | ICD-10-CM | POA: Diagnosis present

## 2021-10-08 DIAGNOSIS — S0093XA Contusion of unspecified part of head, initial encounter: Secondary | ICD-10-CM | POA: Diagnosis not present

## 2021-10-08 MED ORDER — TETANUS-DIPHTH-ACELL PERTUSSIS 5-2.5-18.5 LF-MCG/0.5 IM SUSY
PREFILLED_SYRINGE | INTRAMUSCULAR | Status: AC
  Filled 2021-10-08: qty 0.5

## 2021-10-08 MED ORDER — LIDOCAINE-EPINEPHRINE (PF) 2 %-1:200000 IJ SOLN
20.0000 mL | Freq: Once | INTRAMUSCULAR | Status: DC
  Filled 2021-10-08: qty 20

## 2021-10-08 MED ORDER — TETANUS-DIPHTH-ACELL PERTUSSIS 5-2.5-18.5 LF-MCG/0.5 IM SUSY
0.5000 mL | PREFILLED_SYRINGE | Freq: Once | INTRAMUSCULAR | Status: AC
  Administered 2021-10-08: 0.5 mL via INTRAMUSCULAR
  Filled 2021-10-08: qty 0.5

## 2021-10-08 NOTE — ED Provider Notes (Signed)
[facility] DEPT Provider Note   CSN: [id number] Arrival date & time: 10/08/21  1859     History  Chief Complaint  Patient presents with   Fall   Facial Laceration    JES COSTALES is a 81 y.o. male.  HPI     Aren Pryde is an 81 y.o. male who presents to the ED today after a mechanical fall resulting in abrasions to his face and laceration of his upper lip. 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 He was using his cane at the time of the fall. He hit his head on the floor but did not lose consciousness. He denies HA, changes in his vision or speech, neck or spine pain, pain in his extremities, nausea, and vomiting. He further denies any pain or injury to his teeth, tongue, and cheeks. His bleeding is controlled. He takes Aspirin daily. His last tetanus shot was more than 20 years ago. He has not required any medication to manage his pain as his pain is minimal.    Home Medications Prior to Admission medications   Medication Sig Start Date End Date Taking? Authorizing Provider  ALPRAZolam Duanne Moron) 0.5 MG tablet Take 0.5 mg by mouth 2 (two) times daily as needed for anxiety.     [provider]  amoxicillin-clavulanate (AUGMENTIN) 875-125 MG tablet Take 1 tablet by mouth every 12 (twelve) hours. Patient not taking: Reported on 10/13/2015 09/24/15   Robbie Lis, MD  cephALEXin (KEFLEX) 500 MG capsule Take 500 mg by mouth 2 (two) times daily. 10/15/15   [provider]  docusate sodium (COLACE) 100 MG capsule Take 1 capsule (100 mg total) by mouth 2 (two) times daily. Generic stool softener is fine. Patient not taking: Reported on 10/16/2015 09/24/15   Nat Christen, PA-C  dutasteride (AVODART) 0.5 MG capsule Take 0.5 mg by mouth daily.    [provider]  lisinopril (PRINIVIL,ZESTRIL) 40 MG tablet Take 40 mg by mouth daily.    [provider]  pantoprazole (PROTONIX) 40 MG tablet  Take 1 tablet (40 mg total) by mouth 2 (two) times daily. 09/20/15   Rama, Venetia Maxon, MD  pioglitazone (ACTOS) 15 MG tablet Take 15 mg by mouth daily.    [provider]  rosuvastatin (CRESTOR) 10 MG tablet Take 10 mg by mouth daily.    [provider]  tamsulosin (FLOMAX) 0.4 MG CAPS capsule Take 0.4 mg by mouth daily.    [provider]  traMADol (ULTRAM) 50 MG tablet Take 1 tablet (50 mg total) by mouth every 12 (twelve) hours as needed for moderate pain. 09/24/15   Robbie Lis, MD      Allergies    Chocolate    Review of Systems   Review of Systems  Constitutional:  Positive for activity change.  Hematological:  Does not bruise/bleed easily.   Physical Exam Updated Vital Signs BP 121/79 (BP Location: Right Arm)    Pulse 64    Temp 97.8 F (36.6 C) (Oral)    Resp 18    SpO2 100%  Physical Exam Vitals and nursing note reviewed.  Constitutional:      Appearance: He is well-developed.  HENT:     Head: Atraumatic.  Eyes:     Extraocular Movements: Extraocular movements intact.  Cardiovascular:     Rate and Rhythm: Normal rate.  Pulmonary:     Effort: Pulmonary effort is normal.  Musculoskeletal:  Cervical back: Neck supple.  Skin:    General: Skin is warm.     Comments: Patient has a 4 cm superficial, nongaping laceration to the chin. Patient has a 3 cm deep laceration to the right upper lip that crosses the vermilion border  Neurological:     Mental Status: He is alert and oriented to person, place, and time.    ED Results / Procedures / Treatments   Labs (all labs ordered are listed, but only abnormal results are displayed) Labs Reviewed - No data to display  EKG None  Radiology No results found.  Procedures .Nerve Block  Date/Time: 10/08/2021 11:17 PM Performed by: Varney Biles, MD Authorized by: Varney Biles, MD   Consent:    Consent obtained:  Verbal   Consent given by:  Patient   Risks, benefits, and alternatives  were discussed: yes     Risks discussed:  Infection, nerve damage, swelling, unsuccessful block and bleeding Universal protocol:    Procedure explained and questions answered to patient or proxy's satisfaction: yes     Patient identity confirmed:  Arm band Indications:    Indications:  Procedural anesthesia Location:    Body area:  Head   Head nerve blocked: Infraorbital.   Laterality:  Right Pre-procedure details:    Preparation: Patient was prepped and draped in usual sterile fashion   Procedure details:    Block needle gauge:  25 G   Anesthetic injected:  Lidocaine 1% WITH epi   Injection procedure:  Anatomic landmarks identified Post-procedure details:    Dressing:  None   Outcome:  Anesthesia achieved   Procedure completion:  Tolerated well, no immediate complications .Marland KitchenLaceration Repair  Date/Time: 10/08/2021 11:19 PM Performed by: Varney Biles, MD Authorized by: Varney Biles, MD   Consent:    Consent obtained:  Verbal   Consent given by:  Patient   Risks, benefits, and alternatives were discussed: yes     Risks discussed:  Infection, pain, poor cosmetic result, need for additional repair and poor wound healing   Alternatives discussed:  No treatment Universal protocol:    Procedure explained and questions answered to patient or proxy's satisfaction: yes     Patient identity confirmed:  Arm band Laceration details:    Location:  Lip   Lip location:  Upper lip, full thickness   Vermilion border involved: yes     Height of lip laceration:  More than half vertical height   Length (cm):  3   Depth (mm):  1 Pre-procedure details:    Preparation:  Patient was prepped and draped in usual sterile fashion Exploration:    Limited defect created (wound extended): no     Imaging outcome: foreign body not noted     Wound exploration: wound explored through full range of motion and entire depth of wound visualized     Contaminated: no   Treatment:    Area cleansed  with:  Saline   Amount of cleaning:  Extensive   Irrigation solution:  Sterile water   Irrigation volume:  50   Irrigation method:  Syringe   Debridement:  None   Undermining:  None Skin repair:    Repair method:  Sutures   Suture size:  6-0   Suture material:  Nylon   Suture technique:  Simple interrupted   Number of sutures:  8 (2 x 4-0 sutures, 6 x 6-0) Approximation:    Approximation:  Close   Vermilion border well-aligned: yes   Repair type:  Repair type:  Complex Post-procedure details:    Dressing:  Open (no dressing)   Procedure completion:  Tolerated Wound closure utilizing adhes only  Date/Time: 10/08/2021 11:21 PM Performed by: Varney Biles, MD Authorized by: Varney Biles, MD  Consent: Verbal consent obtained. Risks and benefits: risks, benefits and alternatives were discussed Consent given by: patient Patient understanding: patient states understanding of the procedure being performed Patient identity confirmed: arm band Time out: Immediately prior to procedure a "time out" was called to verify the correct patient, procedure, equipment, support staff and site/side marked as required. Preparation: Patient was prepped and draped in the usual sterile fashion. Patient tolerance: patient tolerated the procedure well with no immediate complications Comments: Dermabond applied to the superficial laceration, Steri-Strip placed thereafter      Medications Ordered in ED Medications  lidocaine-EPINEPHrine (XYLOCAINE W/EPI) 2 %-1:200000 (PF) injection 20 mL (has no administration in time range)  Tdap (BOOSTRIX) injection 0.5 mL (0.5 mLs Intramuscular Given 10/08/21 2130)    ED Course/ Medical Decision Making/ A&P                           Medical Decision Making Amount and/or Complexity of Data Reviewed Radiology: ordered.  Risk Prescription drug management.   81 year old comes in with chief complaint of fall and resultant facial laceration.  Patient  had a mechanical fall.  He has evidence of blunt trauma to his face -specifically at the forehead in the center.  There is stellate type superficial laceration there which is not amenable to any treatment.  There is a lip laceration and laceration to the chin that will require repairs  CT scan of the head ordered as there is concerns for traumatic brain injury, subdural hematoma.  If CT is negative then no further imaging is indicated.  The laceration was repaired without any complications. Wound care precautions discussed.  Patient advised to follow-up with his PCP in 5 to 7 days for suture removal. Strict ER return precautions have been discussed, and patient is agreeing with the plan and is comfortable with the workup done and the recommendations from the ER.   Final Clinical Impression(s) / ED Diagnoses Final diagnoses:  Facial laceration, initial encounter  Chin laceration, initial encounter  Contusion of face, initial encounter  Fall, initial encounter    Rx / DC Orders ED Discharge Orders     None         Varney Biles, MD 10/08/21 [phone]

## 2021-10-08 NOTE — Discharge Instructions (Signed)
You are seen in the ER after you had a fall and resultant injury to your face.  The lip laceration was complex, and repaired in the ER with sutures. The chin laceration was simple and repaired with Dermabond.  We recommend that you follow-up with your primary care doctor in 5 to 7 days for suture removal.  Next best option would be urgent care and then ER for suture removal.  Return to the ER if you start having increased swelling, pain, purulent drainage, facial swelling.

## 2021-10-08 NOTE — ED Provider Triage Note (Signed)
Emergency Medicine Provider Triage Evaluation Note  Gary Roach , a 81 y.o. male  was evaluated in triage.  Pt complains of fall and facial laceration onset prior to arrival.  He was coming from dinner when he tripped on a small step of into the house, patient notes he hit his face.  Has not tried medications for his symptoms.  Patient has a facial laceration to his lip, and laceration to his chin.  Abrasion noted between his eyebrows.  Denies headache, dizziness, blurred vision, nausea, vomiting, LOC.  Patient is not on any anticoagulants.  He takes 81 mg aspirin daily.  Review of Systems  Positive: As per HPI above Negative:   Physical Exam  BP 121/79 (BP Location: Right Arm)    Pulse 64    Temp 97.8 F (36.6 C) (Oral)    Resp 18    SpO2 100%  Gen:   Awake, no distress   Resp:  Normal effort MSK:   Moves extremities without difficulty  Other:  1.5 cm laceration noted to left right upper lip, not through and through.  Laceration noted to chin.  Abrasion noted between eyebrows.  Medical Decision Making  Medically screening exam initiated at 7:46 PM.  Appropriate orders placed.  [name] was informed that the remainder of the evaluation will be completed by another provider, this initial triage assessment does not replace that evaluation, and the importance of remaining in the ED until their evaluation is complete.  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    Clarity Ciszek A, PA-C 10/08/21 [id number]

## 2021-10-08 NOTE — ED Triage Notes (Signed)
Patient states he was coming back from dinner and tripped on the small step-up into the house. Patient states he hit his face, denies other injuries. Patient noted to have a laceration to his lip approx. 1-1.5 cm in length, and an abrasion between his eyebrows. Patient denies dizziness, blurred vision, headache, N/V, LOC or other complaints. Patient ambulatory.

## 2021-10-16 ENCOUNTER — Ambulatory Visit (HOSPITAL_COMMUNITY)
Admission: RE | Admit: 2021-10-16 | Discharge: 2021-10-16 | Disposition: A | Discharge status: Home / Self Care | Payer: Medicare Other | Source: Ambulatory Visit | Attending: Endocrinology | Admitting: Endocrinology

## 2021-10-16 ENCOUNTER — Other Ambulatory Visit: Payer: Self-pay

## 2021-10-16 DIAGNOSIS — Z4802 Encounter for removal of sutures: Secondary | ICD-10-CM

## 2021-10-16 DIAGNOSIS — S01511D Laceration without foreign body of lip, subsequent encounter: Secondary | ICD-10-CM | POA: Diagnosis not present

## 2021-10-16 NOTE — ED Triage Notes (Signed)
Pt presents for suture removal of right upper lip. No S/S infection noted. Wound well-approximated. Denies any concerns.

## 2021-12-08 DIAGNOSIS — D225 Melanocytic nevi of trunk: Secondary | ICD-10-CM | POA: Diagnosis not present

## 2021-12-08 DIAGNOSIS — Z85828 Personal history of other malignant neoplasm of skin: Secondary | ICD-10-CM | POA: Diagnosis not present

## 2021-12-08 DIAGNOSIS — Z85820 Personal history of malignant melanoma of skin: Secondary | ICD-10-CM | POA: Diagnosis not present

## 2021-12-08 DIAGNOSIS — L821 Other seborrheic keratosis: Secondary | ICD-10-CM | POA: Diagnosis not present

## 2021-12-08 DIAGNOSIS — D1801 Hemangioma of skin and subcutaneous tissue: Secondary | ICD-10-CM | POA: Diagnosis not present

## 2021-12-08 DIAGNOSIS — D2261 Melanocytic nevi of right upper limb, including shoulder: Secondary | ICD-10-CM | POA: Diagnosis not present

## 2021-12-08 DIAGNOSIS — L812 Freckles: Secondary | ICD-10-CM | POA: Diagnosis not present

## 2021-12-08 DIAGNOSIS — D2262 Melanocytic nevi of left upper limb, including shoulder: Secondary | ICD-10-CM | POA: Diagnosis not present

## 2022-01-26 DIAGNOSIS — E119 Type 2 diabetes mellitus without complications: Secondary | ICD-10-CM | POA: Diagnosis not present

## 2022-01-26 DIAGNOSIS — H2513 Age-related nuclear cataract, bilateral: Secondary | ICD-10-CM | POA: Diagnosis not present

## 2022-01-26 DIAGNOSIS — H524 Presbyopia: Secondary | ICD-10-CM | POA: Diagnosis not present

## 2022-01-26 DIAGNOSIS — H25043 Posterior subcapsular polar age-related cataract, bilateral: Secondary | ICD-10-CM | POA: Diagnosis not present

## 2022-03-07 DIAGNOSIS — M179 Osteoarthritis of knee, unspecified: Secondary | ICD-10-CM | POA: Diagnosis not present

## 2022-03-07 DIAGNOSIS — I129 Hypertensive chronic kidney disease with stage 1 through stage 4 chronic kidney disease, or unspecified chronic kidney disease: Secondary | ICD-10-CM | POA: Diagnosis not present

## 2022-03-07 DIAGNOSIS — K219 Gastro-esophageal reflux disease without esophagitis: Secondary | ICD-10-CM | POA: Diagnosis not present

## 2022-03-07 DIAGNOSIS — E785 Hyperlipidemia, unspecified: Secondary | ICD-10-CM | POA: Diagnosis not present

## 2022-03-07 DIAGNOSIS — E1129 Type 2 diabetes mellitus with other diabetic kidney complication: Secondary | ICD-10-CM | POA: Diagnosis not present

## 2022-03-07 DIAGNOSIS — C4331 Malignant melanoma of nose: Secondary | ICD-10-CM | POA: Diagnosis not present

## 2022-03-07 DIAGNOSIS — E559 Vitamin D deficiency, unspecified: Secondary | ICD-10-CM | POA: Diagnosis not present

## 2022-03-07 DIAGNOSIS — E669 Obesity, unspecified: Secondary | ICD-10-CM | POA: Diagnosis not present

## 2022-03-07 DIAGNOSIS — I10 Essential (primary) hypertension: Secondary | ICD-10-CM | POA: Diagnosis not present

## 2022-03-07 DIAGNOSIS — F5221 Male erectile disorder: Secondary | ICD-10-CM | POA: Diagnosis not present

## 2022-03-07 DIAGNOSIS — N1831 Chronic kidney disease, stage 3a: Secondary | ICD-10-CM | POA: Diagnosis not present

## 2022-03-07 DIAGNOSIS — N401 Enlarged prostate with lower urinary tract symptoms: Secondary | ICD-10-CM | POA: Diagnosis not present

## 2022-05-08 DIAGNOSIS — N401 Enlarged prostate with lower urinary tract symptoms: Secondary | ICD-10-CM | POA: Diagnosis not present

## 2022-05-15 DIAGNOSIS — R3914 Feeling of incomplete bladder emptying: Secondary | ICD-10-CM | POA: Diagnosis not present

## 2022-05-15 DIAGNOSIS — N401 Enlarged prostate with lower urinary tract symptoms: Secondary | ICD-10-CM | POA: Diagnosis not present

## 2022-05-18 DIAGNOSIS — H838X3 Other specified diseases of inner ear, bilateral: Secondary | ICD-10-CM | POA: Diagnosis not present

## 2022-05-18 DIAGNOSIS — H6123 Impacted cerumen, bilateral: Secondary | ICD-10-CM | POA: Diagnosis not present

## 2022-05-18 DIAGNOSIS — H903 Sensorineural hearing loss, bilateral: Secondary | ICD-10-CM | POA: Diagnosis not present

## 2022-06-08 DIAGNOSIS — L821 Other seborrheic keratosis: Secondary | ICD-10-CM | POA: Diagnosis not present

## 2022-06-08 DIAGNOSIS — D1801 Hemangioma of skin and subcutaneous tissue: Secondary | ICD-10-CM | POA: Diagnosis not present

## 2022-06-08 DIAGNOSIS — L812 Freckles: Secondary | ICD-10-CM | POA: Diagnosis not present

## 2022-06-08 DIAGNOSIS — D225 Melanocytic nevi of trunk: Secondary | ICD-10-CM | POA: Diagnosis not present

## 2022-06-08 DIAGNOSIS — Z85820 Personal history of malignant melanoma of skin: Secondary | ICD-10-CM | POA: Diagnosis not present

## 2022-06-08 DIAGNOSIS — D2271 Melanocytic nevi of right lower limb, including hip: Secondary | ICD-10-CM | POA: Diagnosis not present

## 2022-06-08 DIAGNOSIS — Z85828 Personal history of other malignant neoplasm of skin: Secondary | ICD-10-CM | POA: Diagnosis not present

## 2022-06-27 DIAGNOSIS — Z23 Encounter for immunization: Secondary | ICD-10-CM | POA: Diagnosis not present

## 2022-08-01 DIAGNOSIS — E559 Vitamin D deficiency, unspecified: Secondary | ICD-10-CM | POA: Diagnosis not present

## 2022-08-01 DIAGNOSIS — C4331 Malignant melanoma of nose: Secondary | ICD-10-CM | POA: Diagnosis not present

## 2022-08-01 DIAGNOSIS — E1129 Type 2 diabetes mellitus with other diabetic kidney complication: Secondary | ICD-10-CM | POA: Diagnosis not present

## 2022-08-01 DIAGNOSIS — N401 Enlarged prostate with lower urinary tract symptoms: Secondary | ICD-10-CM | POA: Diagnosis not present

## 2022-08-01 DIAGNOSIS — N200 Calculus of kidney: Secondary | ICD-10-CM | POA: Diagnosis not present

## 2022-08-01 DIAGNOSIS — M179 Osteoarthritis of knee, unspecified: Secondary | ICD-10-CM | POA: Diagnosis not present

## 2022-08-01 DIAGNOSIS — R972 Elevated prostate specific antigen [PSA]: Secondary | ICD-10-CM | POA: Diagnosis not present

## 2022-08-01 DIAGNOSIS — E785 Hyperlipidemia, unspecified: Secondary | ICD-10-CM | POA: Diagnosis not present

## 2022-08-01 DIAGNOSIS — I129 Hypertensive chronic kidney disease with stage 1 through stage 4 chronic kidney disease, or unspecified chronic kidney disease: Secondary | ICD-10-CM | POA: Diagnosis not present

## 2022-08-01 DIAGNOSIS — K219 Gastro-esophageal reflux disease without esophagitis: Secondary | ICD-10-CM | POA: Diagnosis not present

## 2022-08-01 DIAGNOSIS — N1831 Chronic kidney disease, stage 3a: Secondary | ICD-10-CM | POA: Diagnosis not present

## 2022-12-12 DIAGNOSIS — Z85820 Personal history of malignant melanoma of skin: Secondary | ICD-10-CM | POA: Diagnosis not present

## 2022-12-12 DIAGNOSIS — Z85828 Personal history of other malignant neoplasm of skin: Secondary | ICD-10-CM | POA: Diagnosis not present

## 2022-12-12 DIAGNOSIS — D225 Melanocytic nevi of trunk: Secondary | ICD-10-CM | POA: Diagnosis not present

## 2022-12-12 DIAGNOSIS — L821 Other seborrheic keratosis: Secondary | ICD-10-CM | POA: Diagnosis not present

## 2022-12-12 DIAGNOSIS — D1801 Hemangioma of skin and subcutaneous tissue: Secondary | ICD-10-CM | POA: Diagnosis not present

## 2022-12-12 DIAGNOSIS — L853 Xerosis cutis: Secondary | ICD-10-CM | POA: Diagnosis not present

## 2023-01-25 DIAGNOSIS — N401 Enlarged prostate with lower urinary tract symptoms: Secondary | ICD-10-CM | POA: Diagnosis not present

## 2023-01-25 DIAGNOSIS — I129 Hypertensive chronic kidney disease with stage 1 through stage 4 chronic kidney disease, or unspecified chronic kidney disease: Secondary | ICD-10-CM | POA: Diagnosis not present

## 2023-01-25 DIAGNOSIS — E785 Hyperlipidemia, unspecified: Secondary | ICD-10-CM | POA: Diagnosis not present

## 2023-01-25 DIAGNOSIS — N200 Calculus of kidney: Secondary | ICD-10-CM | POA: Diagnosis not present

## 2023-01-25 DIAGNOSIS — N1831 Chronic kidney disease, stage 3a: Secondary | ICD-10-CM | POA: Diagnosis not present

## 2023-01-25 DIAGNOSIS — E1129 Type 2 diabetes mellitus with other diabetic kidney complication: Secondary | ICD-10-CM | POA: Diagnosis not present

## 2023-01-25 DIAGNOSIS — M179 Osteoarthritis of knee, unspecified: Secondary | ICD-10-CM | POA: Diagnosis not present

## 2023-01-25 DIAGNOSIS — C4331 Malignant melanoma of nose: Secondary | ICD-10-CM | POA: Diagnosis not present

## 2023-01-25 DIAGNOSIS — F5221 Male erectile disorder: Secondary | ICD-10-CM | POA: Diagnosis not present

## 2023-01-25 DIAGNOSIS — I10 Essential (primary) hypertension: Secondary | ICD-10-CM | POA: Diagnosis not present

## 2023-01-25 DIAGNOSIS — E559 Vitamin D deficiency, unspecified: Secondary | ICD-10-CM | POA: Diagnosis not present

## 2023-01-25 DIAGNOSIS — E669 Obesity, unspecified: Secondary | ICD-10-CM | POA: Diagnosis not present

## 2023-05-17 DIAGNOSIS — Z125 Encounter for screening for malignant neoplasm of prostate: Secondary | ICD-10-CM | POA: Diagnosis not present

## 2023-05-24 DIAGNOSIS — R3914 Feeling of incomplete bladder emptying: Secondary | ICD-10-CM | POA: Diagnosis not present

## 2023-05-24 DIAGNOSIS — N401 Enlarged prostate with lower urinary tract symptoms: Secondary | ICD-10-CM | POA: Diagnosis not present

## 2023-05-24 DIAGNOSIS — N3000 Acute cystitis without hematuria: Secondary | ICD-10-CM | POA: Diagnosis not present

## 2023-06-02 DIAGNOSIS — Z23 Encounter for immunization: Secondary | ICD-10-CM | POA: Diagnosis not present

## 2023-06-04 DIAGNOSIS — L821 Other seborrheic keratosis: Secondary | ICD-10-CM | POA: Diagnosis not present

## 2023-06-04 DIAGNOSIS — D225 Melanocytic nevi of trunk: Secondary | ICD-10-CM | POA: Diagnosis not present

## 2023-06-04 DIAGNOSIS — L812 Freckles: Secondary | ICD-10-CM | POA: Diagnosis not present

## 2023-06-04 DIAGNOSIS — Z85828 Personal history of other malignant neoplasm of skin: Secondary | ICD-10-CM | POA: Diagnosis not present

## 2023-06-04 DIAGNOSIS — Z85820 Personal history of malignant melanoma of skin: Secondary | ICD-10-CM | POA: Diagnosis not present

## 2023-06-20 DIAGNOSIS — H524 Presbyopia: Secondary | ICD-10-CM | POA: Diagnosis not present

## 2023-06-20 DIAGNOSIS — H2513 Age-related nuclear cataract, bilateral: Secondary | ICD-10-CM | POA: Diagnosis not present

## 2023-06-20 DIAGNOSIS — E119 Type 2 diabetes mellitus without complications: Secondary | ICD-10-CM | POA: Diagnosis not present

## 2023-08-02 DIAGNOSIS — E669 Obesity, unspecified: Secondary | ICD-10-CM | POA: Diagnosis not present

## 2023-08-02 DIAGNOSIS — E559 Vitamin D deficiency, unspecified: Secondary | ICD-10-CM | POA: Diagnosis not present

## 2023-08-02 DIAGNOSIS — E785 Hyperlipidemia, unspecified: Secondary | ICD-10-CM | POA: Diagnosis not present

## 2023-08-02 DIAGNOSIS — C4331 Malignant melanoma of nose: Secondary | ICD-10-CM | POA: Diagnosis not present

## 2023-08-02 DIAGNOSIS — M179 Osteoarthritis of knee, unspecified: Secondary | ICD-10-CM | POA: Diagnosis not present

## 2023-08-02 DIAGNOSIS — E1129 Type 2 diabetes mellitus with other diabetic kidney complication: Secondary | ICD-10-CM | POA: Diagnosis not present

## 2023-08-02 DIAGNOSIS — N200 Calculus of kidney: Secondary | ICD-10-CM | POA: Diagnosis not present

## 2023-08-02 DIAGNOSIS — N401 Enlarged prostate with lower urinary tract symptoms: Secondary | ICD-10-CM | POA: Diagnosis not present

## 2023-08-02 DIAGNOSIS — K219 Gastro-esophageal reflux disease without esophagitis: Secondary | ICD-10-CM | POA: Diagnosis not present

## 2023-08-02 DIAGNOSIS — I129 Hypertensive chronic kidney disease with stage 1 through stage 4 chronic kidney disease, or unspecified chronic kidney disease: Secondary | ICD-10-CM | POA: Diagnosis not present

## 2023-08-02 DIAGNOSIS — N1831 Chronic kidney disease, stage 3a: Secondary | ICD-10-CM | POA: Diagnosis not present

## 2023-08-07 DIAGNOSIS — Z23 Encounter for immunization: Secondary | ICD-10-CM | POA: Diagnosis not present

## 2023-12-04 DIAGNOSIS — Z85828 Personal history of other malignant neoplasm of skin: Secondary | ICD-10-CM | POA: Diagnosis not present

## 2023-12-04 DIAGNOSIS — L720 Epidermal cyst: Secondary | ICD-10-CM | POA: Diagnosis not present

## 2023-12-04 DIAGNOSIS — D1801 Hemangioma of skin and subcutaneous tissue: Secondary | ICD-10-CM | POA: Diagnosis not present

## 2023-12-04 DIAGNOSIS — D225 Melanocytic nevi of trunk: Secondary | ICD-10-CM | POA: Diagnosis not present

## 2023-12-04 DIAGNOSIS — Z85820 Personal history of malignant melanoma of skin: Secondary | ICD-10-CM | POA: Diagnosis not present

## 2023-12-04 DIAGNOSIS — L812 Freckles: Secondary | ICD-10-CM | POA: Diagnosis not present

## 2023-12-04 DIAGNOSIS — L821 Other seborrheic keratosis: Secondary | ICD-10-CM | POA: Diagnosis not present

## 2024-02-07 DIAGNOSIS — N401 Enlarged prostate with lower urinary tract symptoms: Secondary | ICD-10-CM | POA: Diagnosis not present

## 2024-02-07 DIAGNOSIS — N200 Calculus of kidney: Secondary | ICD-10-CM | POA: Diagnosis not present

## 2024-02-07 DIAGNOSIS — E559 Vitamin D deficiency, unspecified: Secondary | ICD-10-CM | POA: Diagnosis not present

## 2024-02-07 DIAGNOSIS — E669 Obesity, unspecified: Secondary | ICD-10-CM | POA: Diagnosis not present

## 2024-02-07 DIAGNOSIS — C4331 Malignant melanoma of nose: Secondary | ICD-10-CM | POA: Diagnosis not present

## 2024-02-07 DIAGNOSIS — E785 Hyperlipidemia, unspecified: Secondary | ICD-10-CM | POA: Diagnosis not present

## 2024-02-07 DIAGNOSIS — I129 Hypertensive chronic kidney disease with stage 1 through stage 4 chronic kidney disease, or unspecified chronic kidney disease: Secondary | ICD-10-CM | POA: Diagnosis not present

## 2024-02-07 DIAGNOSIS — E1129 Type 2 diabetes mellitus with other diabetic kidney complication: Secondary | ICD-10-CM | POA: Diagnosis not present

## 2024-02-07 DIAGNOSIS — N1831 Chronic kidney disease, stage 3a: Secondary | ICD-10-CM | POA: Diagnosis not present

## 2024-02-07 DIAGNOSIS — M179 Osteoarthritis of knee, unspecified: Secondary | ICD-10-CM | POA: Diagnosis not present

## 2024-02-07 DIAGNOSIS — K2980 Duodenitis without bleeding: Secondary | ICD-10-CM | POA: Diagnosis not present

## 2024-02-14 DIAGNOSIS — H9011 Conductive hearing loss, unilateral, right ear, with unrestricted hearing on the contralateral side: Secondary | ICD-10-CM | POA: Diagnosis not present

## 2024-02-14 DIAGNOSIS — H6121 Impacted cerumen, right ear: Secondary | ICD-10-CM | POA: Diagnosis not present

## 2024-05-24 DIAGNOSIS — Z23 Encounter for immunization: Secondary | ICD-10-CM | POA: Diagnosis not present

## 2024-06-04 DIAGNOSIS — Z85820 Personal history of malignant melanoma of skin: Secondary | ICD-10-CM | POA: Diagnosis not present

## 2024-06-04 DIAGNOSIS — L821 Other seborrheic keratosis: Secondary | ICD-10-CM | POA: Diagnosis not present

## 2024-06-04 DIAGNOSIS — L718 Other rosacea: Secondary | ICD-10-CM | POA: Diagnosis not present

## 2024-06-04 DIAGNOSIS — D2272 Melanocytic nevi of left lower limb, including hip: Secondary | ICD-10-CM | POA: Diagnosis not present

## 2024-06-04 DIAGNOSIS — D1801 Hemangioma of skin and subcutaneous tissue: Secondary | ICD-10-CM | POA: Diagnosis not present

## 2024-06-04 DIAGNOSIS — D225 Melanocytic nevi of trunk: Secondary | ICD-10-CM | POA: Diagnosis not present

## 2024-06-04 DIAGNOSIS — Z85828 Personal history of other malignant neoplasm of skin: Secondary | ICD-10-CM | POA: Diagnosis not present

## 2024-06-04 DIAGNOSIS — L812 Freckles: Secondary | ICD-10-CM | POA: Diagnosis not present

## 2024-06-04 DIAGNOSIS — D2271 Melanocytic nevi of right lower limb, including hip: Secondary | ICD-10-CM | POA: Diagnosis not present

## 2024-06-24 DIAGNOSIS — H2513 Age-related nuclear cataract, bilateral: Secondary | ICD-10-CM | POA: Diagnosis not present

## 2024-06-24 DIAGNOSIS — E119 Type 2 diabetes mellitus without complications: Secondary | ICD-10-CM | POA: Diagnosis not present

## 2024-06-24 DIAGNOSIS — H52203 Unspecified astigmatism, bilateral: Secondary | ICD-10-CM | POA: Diagnosis not present

## 2024-06-26 DIAGNOSIS — R3912 Poor urinary stream: Secondary | ICD-10-CM | POA: Diagnosis not present

## 2024-06-26 DIAGNOSIS — R350 Frequency of micturition: Secondary | ICD-10-CM | POA: Diagnosis not present

## 2024-06-26 DIAGNOSIS — N400 Enlarged prostate without lower urinary tract symptoms: Secondary | ICD-10-CM | POA: Diagnosis not present

## 2024-06-26 DIAGNOSIS — R3914 Feeling of incomplete bladder emptying: Secondary | ICD-10-CM | POA: Diagnosis not present

## 2024-06-26 DIAGNOSIS — N401 Enlarged prostate with lower urinary tract symptoms: Secondary | ICD-10-CM | POA: Diagnosis not present

## 2024-06-26 DIAGNOSIS — R351 Nocturia: Secondary | ICD-10-CM | POA: Diagnosis not present
